# Patient Record
Sex: Male | Born: 1970 | Race: White | Hispanic: No | Smoking: Current every day smoker
Health system: Southern US, Community
[De-identification: ages and names within clinical notes are randomized; demographics above are authoritative.]

## PROBLEM LIST (undated history)

## (undated) DIAGNOSIS — F419 Anxiety disorder, unspecified: Secondary | ICD-10-CM

## (undated) DIAGNOSIS — R569 Unspecified convulsions: Secondary | ICD-10-CM

## (undated) DIAGNOSIS — G709 Myoneural disorder, unspecified: Secondary | ICD-10-CM

## (undated) DIAGNOSIS — Z9989 Dependence on other enabling machines and devices: Secondary | ICD-10-CM

## (undated) DIAGNOSIS — M549 Dorsalgia, unspecified: Secondary | ICD-10-CM

## (undated) HISTORY — PX: OTHER SURGICAL HISTORY: SHX169

## (undated) HISTORY — DX: Dorsalgia, unspecified: M54.9

## (undated) HISTORY — DX: Myoneural disorder, unspecified: G70.9

## (undated) HISTORY — DX: Unspecified convulsions: R56.9

## (undated) HISTORY — DX: Anxiety disorder, unspecified: F41.9

---

## 2019-03-02 ENCOUNTER — Other Ambulatory Visit: Payer: Self-pay

## 2019-03-02 ENCOUNTER — Emergency Department (HOSPITAL_COMMUNITY): Payer: Medicaid Other

## 2019-03-02 ENCOUNTER — Encounter (HOSPITAL_COMMUNITY): Payer: Self-pay | Admitting: *Deleted

## 2019-03-02 ENCOUNTER — Inpatient Hospital Stay (HOSPITAL_COMMUNITY)
Admission: EM | Admit: 2019-03-02 | Discharge: 2019-03-08 | DRG: 439 | Disposition: A | Discharge status: Home Health | Payer: Medicaid Other | Attending: Internal Medicine | Admitting: Internal Medicine

## 2019-03-02 DIAGNOSIS — K2980 Duodenitis without bleeding: Secondary | ICD-10-CM | POA: Diagnosis present

## 2019-03-02 DIAGNOSIS — F17210 Nicotine dependence, cigarettes, uncomplicated: Secondary | ICD-10-CM | POA: Diagnosis present

## 2019-03-02 DIAGNOSIS — D7589 Other specified diseases of blood and blood-forming organs: Secondary | ICD-10-CM | POA: Diagnosis present

## 2019-03-02 DIAGNOSIS — D6959 Other secondary thrombocytopenia: Secondary | ICD-10-CM | POA: Diagnosis present

## 2019-03-02 DIAGNOSIS — Z23 Encounter for immunization: Secondary | ICD-10-CM | POA: Diagnosis not present

## 2019-03-02 DIAGNOSIS — N179 Acute kidney failure, unspecified: Secondary | ICD-10-CM | POA: Diagnosis present

## 2019-03-02 DIAGNOSIS — K852 Alcohol induced acute pancreatitis without necrosis or infection: Principal | ICD-10-CM | POA: Diagnosis present

## 2019-03-02 DIAGNOSIS — K8020 Calculus of gallbladder without cholecystitis without obstruction: Secondary | ICD-10-CM | POA: Diagnosis present

## 2019-03-02 DIAGNOSIS — E871 Hypo-osmolality and hyponatremia: Secondary | ICD-10-CM | POA: Diagnosis present

## 2019-03-02 DIAGNOSIS — E872 Acidosis, unspecified: Secondary | ICD-10-CM | POA: Diagnosis present

## 2019-03-02 DIAGNOSIS — Z781 Physical restraint status: Secondary | ICD-10-CM

## 2019-03-02 DIAGNOSIS — K567 Ileus, unspecified: Secondary | ICD-10-CM | POA: Diagnosis present

## 2019-03-02 DIAGNOSIS — R109 Unspecified abdominal pain: Secondary | ICD-10-CM | POA: Diagnosis not present

## 2019-03-02 DIAGNOSIS — D649 Anemia, unspecified: Secondary | ICD-10-CM | POA: Diagnosis present

## 2019-03-02 DIAGNOSIS — K7010 Alcoholic hepatitis without ascites: Secondary | ICD-10-CM | POA: Diagnosis present

## 2019-03-02 DIAGNOSIS — I10 Essential (primary) hypertension: Secondary | ICD-10-CM | POA: Diagnosis present

## 2019-03-02 DIAGNOSIS — E876 Hypokalemia: Secondary | ICD-10-CM | POA: Diagnosis present

## 2019-03-02 DIAGNOSIS — F1010 Alcohol abuse, uncomplicated: Secondary | ICD-10-CM | POA: Diagnosis not present

## 2019-03-02 DIAGNOSIS — E878 Other disorders of electrolyte and fluid balance, not elsewhere classified: Secondary | ICD-10-CM | POA: Diagnosis present

## 2019-03-02 DIAGNOSIS — E0810 Diabetes mellitus due to underlying condition with ketoacidosis without coma: Secondary | ICD-10-CM

## 2019-03-02 DIAGNOSIS — F10231 Alcohol dependence with withdrawal delirium: Secondary | ICD-10-CM | POA: Diagnosis present

## 2019-03-02 DIAGNOSIS — K859 Acute pancreatitis without necrosis or infection, unspecified: Secondary | ICD-10-CM | POA: Diagnosis present

## 2019-03-02 DIAGNOSIS — R1084 Generalized abdominal pain: Secondary | ICD-10-CM

## 2019-03-02 DIAGNOSIS — Z20828 Contact with and (suspected) exposure to other viral communicable diseases: Secondary | ICD-10-CM | POA: Diagnosis present

## 2019-03-02 DIAGNOSIS — E119 Type 2 diabetes mellitus without complications: Secondary | ICD-10-CM

## 2019-03-02 DIAGNOSIS — I169 Hypertensive crisis, unspecified: Secondary | ICD-10-CM | POA: Diagnosis present

## 2019-03-02 HISTORY — DX: Dependence on other enabling machines and devices: Z99.89

## 2019-03-02 LAB — LACTIC ACID, PLASMA
Lactic Acid, Venous: 7.7 mmol/L (ref 0.5–1.9)
Lactic Acid, Venous: 6.5 mmol/L (ref 0.5–1.9)

## 2019-03-02 LAB — COMPREHENSIVE METABOLIC PANEL
ALT: 75 U/L — ABNORMAL HIGH (ref 0–44)
AST: 148 U/L — ABNORMAL HIGH (ref 15–41)
Albumin: 5 g/dL (ref 3.5–5.0)
Alkaline Phosphatase: 108 U/L (ref 38–126)
Anion gap: >20 — ABNORMAL HIGH (ref 5–15)
BUN: 11 mg/dL (ref 6–20)
CO2: 23 mmol/L (ref 22–32)
Calcium: 10.5 mg/dL — ABNORMAL HIGH (ref 8.9–10.3)
Chloride: 78 mmol/L — ABNORMAL LOW (ref 98–111)
Creatinine, Ser: 1.33 mg/dL — ABNORMAL HIGH (ref 0.61–1.24)
GFR calc Af Amer: >60 mL/min (ref >60)
GFR calc non Af Amer: >60 mL/min (ref >60)
Glucose, Bld: 217 mg/dL — ABNORMAL HIGH (ref 70–99)
Potassium: 2.2 mmol/L — CL (ref 3.5–5.1)
Sodium: 131 mmol/L — ABNORMAL LOW (ref 135–145)
Total Bilirubin: 3.3 mg/dL — ABNORMAL HIGH (ref 0.3–1.2)
Total Protein: 9 g/dL — ABNORMAL HIGH (ref 6.5–8.1)

## 2019-03-02 LAB — BASIC METABOLIC PANEL
Anion gap: >20 — ABNORMAL HIGH (ref 5–15)
Anion gap: 19 — ABNORMAL HIGH (ref 5–15)
BUN: 10 mg/dL (ref 6–20)
BUN: 10 mg/dL (ref 6–20)
CO2: 25 mmol/L (ref 22–32)
CO2: 26 mmol/L (ref 22–32)
Calcium: 9 mg/dL (ref 8.9–10.3)
Calcium: 8.3 mg/dL — ABNORMAL LOW (ref 8.9–10.3)
Chloride: 87 mmol/L — ABNORMAL LOW (ref 98–111)
Chloride: 88 mmol/L — ABNORMAL LOW (ref 98–111)
Creatinine, Ser: 1.05 mg/dL (ref 0.61–1.24)
Creatinine, Ser: 0.85 mg/dL (ref 0.61–1.24)
GFR calc Af Amer: >60 mL/min (ref >60)
GFR calc Af Amer: >60 mL/min (ref >60)
GFR calc non Af Amer: >60 mL/min (ref >60)
GFR calc non Af Amer: >60 mL/min (ref >60)
Glucose, Bld: 153 mg/dL — ABNORMAL HIGH (ref 70–99)
Glucose, Bld: 132 mg/dL — ABNORMAL HIGH (ref 70–99)
Potassium: 2.5 mmol/L — CL (ref 3.5–5.1)
Potassium: 3 mmol/L — ABNORMAL LOW (ref 3.5–5.1)
Sodium: 133 mmol/L — ABNORMAL LOW (ref 135–145)
Sodium: 133 mmol/L — ABNORMAL LOW (ref 135–145)

## 2019-03-02 LAB — MAGNESIUM: Magnesium: 0.7 mg/dL — CL (ref 1.7–2.4)

## 2019-03-02 LAB — BLOOD GAS, ARTERIAL
Acid-Base Excess: 7.9 mmol/L — ABNORMAL HIGH (ref 0.0–2.0)
Bicarbonate: 31.8 mmol/L — ABNORMAL HIGH (ref 20.0–28.0)
FIO2: 21
O2 Saturation: 94.8 %
Patient temperature: 38.1
pCO2 arterial: 38.2 mmHg (ref 32.0–48.0)
pH, Arterial: 7.523 — ABNORMAL HIGH (ref 7.350–7.450)
pO2, Arterial: 72.9 mmHg — ABNORMAL LOW (ref 83.0–108.0)

## 2019-03-02 LAB — CBG MONITORING, ED
Glucose-Capillary: 168 mg/dL — ABNORMAL HIGH (ref 70–99)
Glucose-Capillary: 139 mg/dL — ABNORMAL HIGH (ref 70–99)
Glucose-Capillary: 133 mg/dL — ABNORMAL HIGH (ref 70–99)

## 2019-03-02 LAB — URINALYSIS, ROUTINE W REFLEX MICROSCOPIC
Bilirubin Urine: NEGATIVE
Glucose, UA: >=500 mg/dL — AB
Ketones, ur: 5 mg/dL — AB
Leukocytes,Ua: NEGATIVE
Nitrite: NEGATIVE
Protein, ur: >=300 mg/dL — AB
Specific Gravity, Urine: 1.03 (ref 1.005–1.030)
pH: 5 (ref 5.0–8.0)

## 2019-03-02 LAB — CBC WITH DIFFERENTIAL/PLATELET
Abs Immature Granulocytes: 0.12 10*3/uL — ABNORMAL HIGH (ref 0.00–0.07)
Basophils Absolute: 0.1 10*3/uL (ref 0.0–0.1)
Basophils Relative: 0 %
Eosinophils Absolute: 2 10*3/uL — ABNORMAL HIGH (ref 0.0–0.5)
Eosinophils Relative: 12 %
HCT: 48 % (ref 39.0–52.0)
Hemoglobin: 17.4 g/dL — ABNORMAL HIGH (ref 13.0–17.0)
Immature Granulocytes: 1 %
Lymphocytes Relative: 3 %
Lymphs Abs: 0.5 10*3/uL — ABNORMAL LOW (ref 0.7–4.0)
MCH: 34.6 pg — ABNORMAL HIGH (ref 26.0–34.0)
MCHC: 36.3 g/dL — ABNORMAL HIGH (ref 30.0–36.0)
MCV: 95.4 fL (ref 80.0–100.0)
Monocytes Absolute: 1.9 10*3/uL — ABNORMAL HIGH (ref 0.1–1.0)
Monocytes Relative: 12 %
Neutro Abs: 11.6 10*3/uL — ABNORMAL HIGH (ref 1.7–7.7)
Neutrophils Relative %: 72 %
Platelets: 117 10*3/uL — ABNORMAL LOW (ref 150–400)
RBC: 5.03 MIL/uL (ref 4.22–5.81)
RDW: 11.8 % (ref 11.5–15.5)
WBC: 16.1 10*3/uL — ABNORMAL HIGH (ref 4.0–10.5)
nRBC: 0.2 % (ref 0.0–0.2)

## 2019-03-02 LAB — APTT: aPTT: 29 seconds (ref 24–36)

## 2019-03-02 LAB — PROTIME-INR
INR: 1.3 — ABNORMAL HIGH (ref 0.8–1.2)
Prothrombin Time: 15.9 seconds — ABNORMAL HIGH (ref 11.4–15.2)

## 2019-03-02 LAB — PHOSPHORUS: Phosphorus: 3.4 mg/dL (ref 2.5–4.6)

## 2019-03-02 LAB — LIPASE, BLOOD: Lipase: 1441 U/L — ABNORMAL HIGH (ref 11–51)

## 2019-03-02 MED ORDER — SODIUM CHLORIDE 0.9 % IV BOLUS (SEPSIS)
1000.0000 mL | Freq: Once | INTRAVENOUS | Status: AC
  Administered 2019-03-02: 15:00:00 1000 mL via INTRAVENOUS

## 2019-03-02 MED ORDER — MAGNESIUM SULFATE 2 GM/50ML IV SOLN
2.0000 g | Freq: Once | INTRAVENOUS | Status: AC
  Administered 2019-03-03: 2 g via INTRAVENOUS
  Filled 2019-03-02: qty 50

## 2019-03-02 MED ORDER — THIAMINE HCL 100 MG/ML IJ SOLN
100.0000 mg | Freq: Every day | INTRAMUSCULAR | Status: DC
  Administered 2019-03-02: 100 mg via INTRAVENOUS
  Filled 2019-03-02: qty 2

## 2019-03-02 MED ORDER — ONDANSETRON HCL 4 MG/2ML IJ SOLN
4.0000 mg | Freq: Once | INTRAMUSCULAR | Status: AC
  Administered 2019-03-02: 4 mg via INTRAVENOUS
  Filled 2019-03-02: qty 2

## 2019-03-02 MED ORDER — LORAZEPAM 2 MG/ML IJ SOLN: 0.0000 mg | Freq: Three times a day (TID) | INTRAMUSCULAR | Status: DC

## 2019-03-02 MED ORDER — FOLIC ACID 1 MG PO TABS
1.0000 mg | ORAL_TABLET | Freq: Every day | ORAL | Status: DC
  Administered 2019-03-02 – 2019-03-08 (×7): 1 mg via ORAL
  Filled 2019-03-02 (×7): qty 1

## 2019-03-02 MED ORDER — PROCHLORPERAZINE EDISYLATE 10 MG/2ML IJ SOLN
10.0000 mg | Freq: Once | INTRAMUSCULAR | Status: AC
  Administered 2019-03-02: 16:00:00 10 mg via INTRAVENOUS
  Filled 2019-03-02: qty 2

## 2019-03-02 MED ORDER — HYDROMORPHONE HCL 1 MG/ML IJ SOLN
1.0000 mg | INTRAMUSCULAR | Status: DC | PRN
  Administered 2019-03-02 – 2019-03-07 (×19): 1 mg via INTRAVENOUS
  Filled 2019-03-02 (×20): qty 1

## 2019-03-02 MED ORDER — ONDANSETRON HCL 4 MG/2ML IJ SOLN
4.0000 mg | Freq: Four times a day (QID) | INTRAMUSCULAR | Status: DC | PRN
  Administered 2019-03-02 – 2019-03-05 (×4): 4 mg via INTRAVENOUS
  Filled 2019-03-02 (×4): qty 2

## 2019-03-02 MED ORDER — HYDROMORPHONE HCL 1 MG/ML IJ SOLN
1.0000 mg | Freq: Once | INTRAMUSCULAR | Status: AC
  Administered 2019-03-02: 17:00:00 1 mg via INTRAVENOUS
  Filled 2019-03-02: qty 1

## 2019-03-02 MED ORDER — VITAMIN B-1 100 MG PO TABS
100.0000 mg | ORAL_TABLET | Freq: Every day | ORAL | Status: DC
  Administered 2019-03-03 – 2019-03-08 (×6): 100 mg via ORAL
  Filled 2019-03-02 (×6): qty 1

## 2019-03-02 MED ORDER — POTASSIUM CHLORIDE 10 MEQ/100ML IV SOLN
10.0000 meq | INTRAVENOUS | Status: AC
  Administered 2019-03-02 – 2019-03-03 (×6): 10 meq via INTRAVENOUS
  Filled 2019-03-02 (×5): qty 100

## 2019-03-02 MED ORDER — INSULIN ASPART 100 UNIT/ML ~~LOC~~ SOLN
0.0000 [IU] | Freq: Four times a day (QID) | SUBCUTANEOUS | Status: DC
  Administered 2019-03-02 – 2019-03-07 (×3): 2 [IU] via SUBCUTANEOUS
  Filled 2019-03-02 (×2): qty 1

## 2019-03-02 MED ORDER — POTASSIUM CHLORIDE CRYS ER 20 MEQ PO TBCR
40.0000 meq | EXTENDED_RELEASE_TABLET | Freq: Once | ORAL | Status: AC
  Administered 2019-03-02: 40 meq via ORAL
  Filled 2019-03-02: qty 2

## 2019-03-02 MED ORDER — SODIUM CHLORIDE 0.9 % IV SOLN
2.0000 g | Freq: Once | INTRAVENOUS | Status: DC
  Administered 2019-03-02: 2 g via INTRAVENOUS
  Filled 2019-03-02: qty 20

## 2019-03-02 MED ORDER — SODIUM CHLORIDE 0.9 % IV SOLN
1000.0000 mL | INTRAVENOUS | Status: DC
  Administered 2019-03-02: 1000 mL via INTRAVENOUS

## 2019-03-02 MED ORDER — METRONIDAZOLE IN NACL 5-0.79 MG/ML-% IV SOLN
500.0000 mg | Freq: Once | INTRAVENOUS | Status: DC
  Filled 2019-03-02: qty 100

## 2019-03-02 MED ORDER — SODIUM CHLORIDE 0.9 % IV BOLUS (SEPSIS)
1000.0000 mL | Freq: Once | INTRAVENOUS | Status: AC
  Administered 2019-03-02: 16:00:00 1000 mL via INTRAVENOUS

## 2019-03-02 MED ORDER — ADULT MULTIVITAMIN W/MINERALS CH
1.0000 | ORAL_TABLET | Freq: Every day | ORAL | Status: DC
  Administered 2019-03-02 – 2019-03-08 (×7): 1 via ORAL
  Filled 2019-03-02 (×7): qty 1

## 2019-03-02 MED ORDER — IOHEXOL 300 MG/ML  SOLN
100.0000 mL | Freq: Once | INTRAMUSCULAR | Status: AC | PRN
  Administered 2019-03-02: 16:00:00 100 mL via INTRAVENOUS

## 2019-03-02 MED ORDER — ONDANSETRON HCL 4 MG PO TABS: 4.0000 mg | ORAL_TABLET | Freq: Four times a day (QID) | ORAL | Status: DC | PRN

## 2019-03-02 MED ORDER — INSULIN GLARGINE 100 UNIT/ML ~~LOC~~ SOLN
15.0000 [IU] | Freq: Every day | SUBCUTANEOUS | Status: DC
  Administered 2019-03-02: 15 [IU] via SUBCUTANEOUS
  Filled 2019-03-02 (×3): qty 0.15

## 2019-03-02 MED ORDER — SODIUM CHLORIDE 0.9 % IV SOLN
1000.0000 mL | INTRAVENOUS | Status: DC
  Administered 2019-03-02 – 2019-03-06 (×13): 1000 mL via INTRAVENOUS

## 2019-03-02 MED ORDER — LORAZEPAM 2 MG/ML IJ SOLN
1.0000 mg | INTRAMUSCULAR | Status: AC | PRN
  Administered 2019-03-03: 13:00:00 1 mg via INTRAVENOUS
  Administered 2019-03-03 – 2019-03-04 (×4): 2 mg via INTRAVENOUS
  Administered 2019-03-04: 14:00:00 4 mg via INTRAVENOUS
  Administered 2019-03-04 – 2019-03-05 (×5): 2 mg via INTRAVENOUS
  Administered 2019-03-05: 14:00:00 1 mg via INTRAVENOUS
  Filled 2019-03-02 (×3): qty 1
  Filled 2019-03-02: qty 2
  Filled 2019-03-02 (×8): qty 1

## 2019-03-02 MED ORDER — LORAZEPAM 2 MG/ML IJ SOLN
0.0000 mg | INTRAMUSCULAR | Status: DC
  Administered 2019-03-02: 2 mg via INTRAVENOUS
  Administered 2019-03-03: 11:00:00 1 mg via INTRAVENOUS
  Filled 2019-03-02 (×2): qty 1

## 2019-03-02 MED ORDER — POTASSIUM CHLORIDE 10 MEQ/100ML IV SOLN
10.0000 meq | INTRAVENOUS | Status: AC
  Administered 2019-03-02 (×2): 10 meq via INTRAVENOUS
  Filled 2019-03-02 (×2): qty 100

## 2019-03-02 MED ORDER — LORAZEPAM 1 MG PO TABS
1.0000 mg | ORAL_TABLET | ORAL | Status: AC | PRN
  Administered 2019-03-04: 10:00:00 4 mg via ORAL
  Filled 2019-03-02: qty 4
  Filled 2019-03-02: qty 2

## 2019-03-02 MED ORDER — MORPHINE SULFATE (PF) 4 MG/ML IV SOLN
4.0000 mg | Freq: Once | INTRAVENOUS | Status: AC
  Administered 2019-03-02: 15:00:00 4 mg via INTRAVENOUS
  Filled 2019-03-02: qty 1

## 2019-03-02 MED ORDER — ENOXAPARIN SODIUM 40 MG/0.4ML ~~LOC~~ SOLN
40.0000 mg | SUBCUTANEOUS | Status: DC
  Administered 2019-03-02 – 2019-03-03 (×2): 40 mg via SUBCUTANEOUS
  Filled 2019-03-02 (×3): qty 0.4

## 2019-03-02 NOTE — Progress Notes (Signed)
Patient ID: Timothy Houston, male   DOB: 20-Mar-1971, 48 y.o.   MRN: [id number]  Anion gap from 30 to 21. Chloride, potassium, and sodium all improving. Will continue with current management.   Truett Mainland, DO 03/02/2019 9:20 PM

## 2019-03-02 NOTE — H&P (Signed)
History and Physical  Timothy Houston NWG:[id number] DOB: 07/09/1970 DOA: 03/02/2019  Referring physician: Claude MangesJohana Soto, PA-C, ED provider PCP: Patient, No Pcp Per  Outpatient Specialists:   Patient Coming From: Home  Chief Complaint: Abdominal pain, nausea, vomiting x2 days.  HPI: Timothy Houston is a 48 y.o. male with a history of alcohol abuse.  Patient seen for nausea, vomiting, abdominal pain x2 days.  He has been unable to tolerate any food or oral liquids.  His pain is worsening.  Pain was worse with attempts to consume oral food or liquids.  No palliating factors.  Patient is an alcoholic and drinks a case of beer every couple of days.  He gets the shakes when he does not have alcohol for a couple of days.  His last drink was a couple days ago.  Emergency Department Course: Lipase elevated to 1200.  CT of the abdomen shows acute pancreatitis with duodenitis.  Potassium is 2.2 with a anion gap of greater than 20.  Bicarb is normal at 23.  LFTs minimally elevated.  Lactic acid 7.7 and 6.5 on repeat.  White count 16.1.  Patient did receive a gram of Rocephin with concerns of sepsis.  He was about to get Flagyl and this was discontinued due to the realization that he had pancreatitis with a lactic acidosis.  Review of Systems:   Pt denies any fevers, chills, nausea, vomiting, diarrhea, constipation, shortness of breath, dyspnea on exertion, orthopnea, cough, wheezing, palpitations, headache, vision changes, lightheadedness, dizziness, melena, rectal bleeding.  Review of systems are otherwise negative  Past Medical History:  Diagnosis Date   Ambulates with cane    History reviewed. No pertinent surgical history. Social History:  reports that he has been smoking cigarettes. He has been smoking about 0.50 packs per day. He has never used smokeless tobacco. He reports current alcohol use. He reports current drug use. Drug: Marijuana. Patient lives at home  No Known Allergies  No family  history on file.  family history of diabetes  Prior to Admission medications   Not on File    Physical Exam: BP (!) 172/110    Pulse (!) 144    Temp (!) 100.6 F (38.1 C) (Rectal)    Resp (!) 22    Ht 5\' 8"  (1.727 m)    Wt 88.5 kg    SpO2 98%    BMI 29.65 kg/m    General: Middle-age male. Awake and alert and oriented x3. No acute cardiopulmonary distress.   HEENT: Normocephalic atraumatic.  Right and left ears normal in appearance.  Pupils equal, round, reactive to light. Extraocular muscles are intact. Sclerae anicteric and noninjected.  Moist mucosal membranes. No mucosal lesions.   Neck: Neck supple without lymphadenopathy. No carotid bruits. No masses palpated.   Cardiovascular: Regular rate with normal S1-S2 sounds. No murmurs, rubs, gallops auscultated. No JVD.   Respiratory: Good respiratory effort with no wheezes, rales, rhonchi. Lungs clear to auscultation bilaterally.  No accessory muscle use.  Abdomen: Soft, epigastric and left upper quadrant tenderness with guarding.  No rebound tenderness. Nondistended. Active bowel sounds. No masses or hepatosplenomegaly   Skin: No rashes, lesions, or ulcerations.  Dry, warm to touch. 2+ dorsalis pedis and radial pulses.  Musculoskeletal: No calf or leg pain. All major joints not erythematous nontender.  No upper or lower joint deformation.  Good ROM.  No contractures   Psychiatric: Intact judgment and insight. Pleasant and cooperative.  Neurologic: No focal neurological deficits. Strength is 5/5 and  symmetric in upper and lower extremities.  Cranial nerves II through XII are grossly intact.           Labs on Admission: I have personally reviewed following labs and imaging studies  CBC: Recent Labs  Lab 03/02/19 1440  WBC 16.1*  NEUTROABS 11.6*  HGB 17.4*  HCT 48.0  MCV 95.4  PLT 631*   Basic Metabolic Panel: Recent Labs  Lab 03/02/19 1440  NA 131*  K 2.2*  CL 78*  CO2 23  GLUCOSE 217*  BUN 11  CREATININE 1.33*    CALCIUM 10.5*   GFR: Estimated Creatinine Clearance: 74.2 mL/min (A) (by C-G formula based on SCr of 1.33 mg/dL (H)). Liver Function Tests: Recent Labs  Lab 03/02/19 1440  AST 148*  ALT 75*  ALKPHOS 108  BILITOT 3.3*  PROT 9.0*  ALBUMIN 5.0   Recent Labs  Lab 03/02/19 1440  LIPASE 1,441*   No results for input(s): AMMONIA in the last 168 hours. Coagulation Profile: Recent Labs  Lab 03/02/19 1613  INR 1.3*   Cardiac Enzymes: No results for input(s): CKTOTAL, CKMB, CKMBINDEX, TROPONINI in the last 168 hours. BNP (last 3 results) No results for input(s): PROBNP in the last 8760 hours. HbA1C: No results for input(s): HGBA1C in the last 72 hours. CBG: Recent Labs  Lab 03/02/19 1733  GLUCAP 168*   Lipid Profile: No results for input(s): CHOL, HDL, LDLCALC, TRIG, CHOLHDL, LDLDIRECT in the last 72 hours. Thyroid Function Tests: No results for input(s): TSH, T4TOTAL, FREET4, T3FREE, THYROIDAB in the last 72 hours. Anemia Panel: No results for input(s): VITAMINB12, FOLATE, FERRITIN, TIBC, IRON, RETICCTPCT in the last 72 hours. Urine analysis:    Component Value Date/Time   COLORURINE AMBER (A) 03/02/2019 1620   APPEARANCEUR HAZY (A) 03/02/2019 1620   LABSPEC 1.030 03/02/2019 1620   PHURINE 5.0 03/02/2019 1620   GLUCOSEU >=500 (A) 03/02/2019 1620   HGBUR MODERATE (A) 03/02/2019 1620   BILIRUBINUR NEGATIVE 03/02/2019 1620   KETONESUR 5 (A) 03/02/2019 1620   PROTEINUR >=300 (A) 03/02/2019 1620   NITRITE NEGATIVE 03/02/2019 1620   LEUKOCYTESUR NEGATIVE 03/02/2019 1620   Sepsis Labs: @LABRCNTIP (procalcitonin:4,lacticidven:4) ) Recent Results (from the past 240 hour(s))  Blood Culture (routine x 2)     Status: None (Preliminary result)   Collection Time: 03/02/19  2:47 PM   Specimen: Left Antecubital; Blood  Result Value Ref Range Status   Specimen Description LEFT ANTECUBITAL  Final   Special Requests   Final    BOTTLES DRAWN AEROBIC AND ANAEROBIC Blood  Culture adequate volume Performed at [facility], [address], [place], [place] [address]    Culture PENDING  Incomplete   Report Status PENDING  Incomplete  Blood Culture (routine x 2)     Status: None (Preliminary result)   Collection Time: 03/02/19  2:49 PM   Specimen: Right Antecubital; Blood  Result Value Ref Range Status   Specimen Description RIGHT ANTECUBITAL  Final   Special Requests   Final    BOTTLES DRAWN AEROBIC AND ANAEROBIC Blood Culture adequate volume Performed at [facility], [address], [place],  [address]    Culture PENDING  Incomplete   Report Status PENDING  Incomplete     Radiological Exams on Admission: Ct Abdomen Pelvis W Contrast  Result Date: 03/02/2019 CLINICAL DATA:  Pt c/o severe mid abdominal pain and vomiting x 2 days. Denies diarrhea. Pt is diaphoretic^171mL OMNIPAQUE IOHEXOL 300 MG/ML SOLN EXAM: CT ABDOMEN AND PELVIS WITH CONTRAST TECHNIQUE:  Multidetector CT imaging of the abdomen and pelvis was performed using the standard protocol following bolus administration of intravenous contrast. CONTRAST:  OMNIPAQUE IOHEXOL 300 MG/ML  SOLN COMPARISON:  None. FINDINGS: Lower chest: Bandlike opacity at the right lung base likely reflects atelectasis. No pleural or pericardial effusion. Hepatobiliary: Diffuse hepatic steatosis. There are multiple small layering gallstones. No evidence of gallbladder wall thickening. No biliary dilation. Pancreas: Heterogeneous appearance of the pancreatic head with peripancreatic fat stranding predominantly involving the pancreatic head but extending diffusely along the body and tail most likely representing acute pancreatitis. No focal peripancreatic fluid collection. No pancreatic duct dilation. There is duodenal wall thickening in the first through fourth portions with associated fat stranding. Spleen: Normal in size without focal abnormality. Adrenals/Urinary Tract: Adrenal glands are unremarkable. Kidneys are  normal, without renal calculi, focal lesion, or hydronephrosis. Bladder is unremarkable. Stomach/Bowel: Stomach is within normal limits. Appendix appears normal. Multiple mildly dilated loops of both large and small bowel favored to represent ileus. No bowel wall thickening. No free air. Vascular/Lymphatic: No significant vascular findings are present. No enlarged abdominal or pelvic lymph nodes. Reproductive: Prostate is unremarkable. Other: No abdominal wall hernia or abnormality. Musculoskeletal: Age-indeterminate mild height loss at the T11 vertebral body. IMPRESSION: 1. Heterogeneous appearance of the pancreatic head with peripancreatic fat stranding predominantly involving the pancreatic head but extending along the body and tail, most likely representing acute pancreatitis. No focal peripancreatic fluid collection. 2. Duodenal wall thickening with inflammatory changes likely a reactive duodenitis. 3. Hepatic steatosis. 4. Cholelithiasis without evidence of cholecystitis. 5. Multiple mildly dilated loops of both large and small bowel favored to represent ileus. 6. Age-indeterminate height loss at the T11 vertebral body. Electronically Signed   By: Emmaline Kluver M.D.   On: 03/02/2019 16:09    EKG: Independently reviewed.  Sinus tachycardia with possible old anterior infarct.  No acute ST changes  Assessment/Plan: Principal Problem:   Pancreatitis Active Problems:   Alcohol abuse   Type 2 diabetes mellitus without complication (HCC)   Lactic acidosis   Acute hypokalemia    This patient was discussed with the ED physician, including pertinent vitals, physical exam findings, labs, and imaging.  We also discussed care given by the ED provider.  1. Pancreatitis a. Admit to stepdown b. Recheck lipase in the morning c. Aggressive fluid hydration d. N.p.o. e. Dilaudid for pain control 2. Type 2 diabetes a. Bicarb is normal b. Question of whether the patient is in DKA given no clear  acidosis c. Placed on Lantus 15 units with sliding scale every 6 hours d. Hemoglobin A1c is pending e. We will hold off from insulin drip as the patient severely hypokalemic, however may need to give D5 normal saline with insulin if electrolytes do not start to return to normal f. BMPs every 4 hours 3. Alcohol abuse a. Withdrawal protocol 4. Lactic acidosis a. Improvement of lactic acidosis b. Repeat lactic acid in the morning 5. Acute hypokalemia a. Severe hypokalemia with hypochloremia, likely secondary to GI losses b. 6 runs of IV potassium ordered, will also give oral potassium as the patient tolerated oral potassium in the ER c. Repeat BMPs every 4 hours for now  DVT prophylaxis: Lovenox Consultants: None Code Status: Full code Family Communication: None Disposition Plan: Patient should be able to return home following resolution of pancreatitis and improvement of electrolyte abnormalities   Levie Heritage, DO

## 2019-03-02 NOTE — ED Notes (Signed)
Date and time results received: 03/02/19 2332   Test: Magnesium Critical Value: 0.7  Name of Provider Notified: Nehemiah Settle, MD

## 2019-03-02 NOTE — ED Provider Notes (Signed)
[place] [facility]   CSN: [id number] Arrival date & time: 03/02/19  1404     History   Chief Complaint Chief Complaint  Patient presents with   Abdominal Pain    HPI Timothy Houston is a 48 y.o. male.     48 y.o male with no PMH presents to the ED with a chief complaint of abdominal pain x 2 days. Patient describes a sharp pain to the epigastric region without any radiation.  He also endorses anorexia, reports he has been unable to keep any of his food down for the past 2 days.  Endorses multiple episodes of vomiting, without any blood.  He has not tried any medication for improvement in symptoms.  Reports his last meal was prior to arrival cracker jacks and Pepsi's.  He also endorses chills and a subjective fever although he does not have a thermometer at home.  Reports the pain seems to be worsening as the days pass.  Last bowel movement was yesterday, normal in nature.  Does endorse drinking a 12 pack every 3 days.  Has no prior surgical history to his abdomen.  Denies any suspicious food intakes.  He denies any chest pain, shortness of breath, urinary symptoms.   The history is provided by the patient.  Abdominal Pain Pain location:  Generalized Pain quality: not sharp   Pain radiates to:  Does not radiate Pain severity:  Severe Onset quality:  Gradual Duration:  2 days Timing:  Constant Progression:  Worsening Chronicity:  New Context: alcohol use   Context: not diet changes, not previous surgeries, not recent travel, not sick contacts and not suspicious food intake   Relieved by:  Nothing Worsened by:  Nothing Ineffective treatments:  None tried Associated symptoms: anorexia, belching, chills, diarrhea, fever and nausea   Associated symptoms: no chest pain, no cough, no dysuria, no hematuria, no shortness of breath, no sore throat and no vomiting   Risk factors: alcohol abuse   Risk factors: has not had multiple surgeries     Past Medical  History:  Diagnosis Date   Ambulates with cane     There are no active problems to display for this patient.   History reviewed. No pertinent surgical history.      Home Medications    Prior to Admission medications   Not on File    Family History No family history on file.  Social History Social History   Tobacco Use   Smoking status: Current Every Day Smoker    Packs/day: 0.50    Types: Cigarettes   Smokeless tobacco: Never Used  Substance Use Topics   Alcohol use: Yes    Comment: on the weekends    Drug use: Yes    Types: Marijuana     Allergies   Patient has no known allergies.   Review of Systems Review of Systems  Constitutional: Positive for chills and fever.  HENT: Negative for ear pain and sore throat.   Eyes: Negative for pain and visual disturbance.  Respiratory: Negative for cough and shortness of breath.   Cardiovascular: Negative for chest pain and palpitations.  Gastrointestinal: Positive for abdominal pain, anorexia, diarrhea and nausea. Negative for vomiting.  Genitourinary: Negative for dysuria and hematuria.  Musculoskeletal: Negative for arthralgias and back pain.  Skin: Negative for color change and rash.  Neurological: Negative for seizures and syncope.  All other systems reviewed and are negative.    Physical Exam Updated Vital Signs BP (!) 147/101  Pulse (!) 144    Temp 97.9 F (36.6 C) (Oral)    Resp 19    Ht  (1.727 m)    Wt 88.5 kg    SpO2 95%    BMI 29.65 kg/m   Physical Exam Vitals signs and nursing note reviewed.  Constitutional:      Appearance: He is well-developed. He is ill-appearing and diaphoretic.  HENT:     Head: Normocephalic and atraumatic.     Mouth/Throat:     Mouth: Mucous membranes are dry.     Tongue: Tongue does not deviate from midline.     Pharynx: Oropharynx is clear. Uvula midline.  Eyes:     General: No scleral icterus.    Pupils: Pupils are equal, round, and reactive to light.    Neck:     Musculoskeletal: Normal range of motion.  Cardiovascular:     Rate and Rhythm: Tachycardia present.     Heart sounds: Normal heart sounds.  Pulmonary:     Effort: Pulmonary effort is normal.     Breath sounds: Normal breath sounds. No wheezing.  Chest:     Chest wall: No tenderness.  Abdominal:     General: Bowel sounds are decreased. There is distension.     Palpations: Abdomen is soft.     Tenderness: There is generalized abdominal tenderness. There is guarding and rebound. There is no right CVA tenderness or left CVA tenderness.     Hernia: No hernia is present.     Comments: Significant tenderness to palpation throughout whole stomach.  There is guarding, rebound.  Bowel sounds are diminished.  Musculoskeletal:        General: No tenderness or deformity.  Neurological:     Mental Status: He is alert and oriented to person, place, and time.      ED Treatments / Results  Labs (all labs ordered are listed, but only abnormal results are displayed) Labs Reviewed  LACTIC ACID, PLASMA - Abnormal; Notable for the following components:      Result Value   Lactic Acid, Venous 7.7 (*)    All other components within normal limits  COMPREHENSIVE METABOLIC PANEL - Abnormal; Notable for the following components:   Sodium 131 (*)    Potassium 2.2 (*)    Chloride 78 (*)    Glucose, Bld 217 (*)    Creatinine, Ser 1.33 (*)    Calcium 10.5 (*)    Total Protein 9.0 (*)    AST 148 (*)    ALT 75 (*)    Total Bilirubin 3.3 (*)    Anion gap >20 (*)    All other components within normal limits  CBC WITH DIFFERENTIAL/PLATELET - Abnormal; Notable for the following components:   WBC 16.1 (*)    Hemoglobin 17.4 (*)    MCH 34.6 (*)    MCHC 36.3 (*)    Platelets 117 (*)    Neutro Abs 11.6 (*)    Lymphs Abs 0.5 (*)    Monocytes Absolute 1.9 (*)    Eosinophils Absolute 2.0 (*)    Abs Immature Granulocytes 0.12 (*)    All other components within normal limits  PROTIME-INR -  Abnormal; Notable for the following components:   Prothrombin Time 15.9 (*)    INR 1.3 (*)    All other components within normal limits  CULTURE, BLOOD (ROUTINE X 2)  CULTURE, BLOOD (ROUTINE X 2)  URINE CULTURE  SARS CORONAVIRUS 2 (TAT 6-24 HRS)  APTT  LACTIC  ACID, PLASMA  URINALYSIS, ROUTINE W REFLEX MICROSCOPIC  LIPASE, BLOOD    EKG None  Radiology Ct Abdomen Pelvis W Contrast  Result Date: 03/02/2019 CLINICAL DATA:  Pt c/o severe mid abdominal pain and vomiting x 2 days. Denies diarrhea. Pt is diaphoretic^151mL OMNIPAQUE IOHEXOL 300 MG/ML SOLN EXAM: CT ABDOMEN AND PELVIS WITH CONTRAST TECHNIQUE: Multidetector CT imaging of the abdomen and pelvis was performed using the standard protocol following bolus administration of intravenous contrast. CONTRAST:  OMNIPAQUE IOHEXOL 300 MG/ML  SOLN COMPARISON:  None. FINDINGS: Lower chest: Bandlike opacity at the right lung base likely reflects atelectasis. No pleural or pericardial effusion. Hepatobiliary: Diffuse hepatic steatosis. There are multiple small layering gallstones. No evidence of gallbladder wall thickening. No biliary dilation. Pancreas: Heterogeneous appearance of the pancreatic head with peripancreatic fat stranding predominantly involving the pancreatic head but extending diffusely along the body and tail most likely representing acute pancreatitis. No focal peripancreatic fluid collection. No pancreatic duct dilation. There is duodenal wall thickening in the first through fourth portions with associated fat stranding. Spleen: Normal in size without focal abnormality. Adrenals/Urinary Tract: Adrenal glands are unremarkable. Kidneys are normal, without renal calculi, focal lesion, or hydronephrosis. Bladder is unremarkable. Stomach/Bowel: Stomach is within normal limits. Appendix appears normal. Multiple mildly dilated loops of both large and small bowel favored to represent ileus. No bowel wall thickening. No free air.  Vascular/Lymphatic: No significant vascular findings are present. No enlarged abdominal or pelvic lymph nodes. Reproductive: Prostate is unremarkable. Other: No abdominal wall hernia or abnormality. Musculoskeletal: Age-indeterminate mild height loss at the T11 vertebral body. IMPRESSION: 1. Heterogeneous appearance of the pancreatic head with peripancreatic fat stranding predominantly involving the pancreatic head but extending along the body and tail, most likely representing acute pancreatitis. No focal peripancreatic fluid collection. 2. Duodenal wall thickening with inflammatory changes likely a reactive duodenitis. 3. Hepatic steatosis. 4. Cholelithiasis without evidence of cholecystitis. 5. Multiple mildly dilated loops of both large and small bowel favored to represent ileus. 6. Age-indeterminate height loss at the T11 vertebral body. Electronically Signed   By: Emmaline Kluver M.D.   On: 03/02/2019 16:09    Procedures .Critical Care Performed by: Claude Manges, PA-C Authorized by: Claude Manges, PA-C   Critical care provider statement:    Critical care time (minutes):  35   Critical care start time:  03/02/2019 2:00 PM   Critical care end time:  03/02/2019 2:35 PM   Critical care time was exclusive of:  Separately billable procedures and treating other patients   Critical care was necessary to treat or prevent imminent or life-threatening deterioration of the following conditions:  Sepsis   Critical care was time spent personally by me on the following activities:  Blood draw for specimens, development of treatment plan with patient or surrogate, discussions with consultants, evaluation of patient's response to treatment, examination of patient, obtaining history from patient or surrogate, ordering and performing treatments and interventions, ordering and review of laboratory studies, ordering and review of radiographic studies, pulse oximetry, re-evaluation of patient's condition and review of  old charts   (including critical care time)  Medications Ordered in ED Medications  0.9 %  sodium chloride infusion (1,000 mLs Intravenous New Bag/Given 03/02/19 1506)  sodium chloride 0.9 % bolus 1,000 mL (1,000 mLs Intravenous New Bag/Given 03/02/19 1507)    And  sodium chloride 0.9 % bolus 1,000 mL (1,000 mLs Intravenous New Bag/Given 03/02/19 1623)    And  sodium chloride 0.9 % bolus 1,000 mL (1,000  mLs Intravenous New Bag/Given 03/02/19 1506)  potassium chloride 10 mEq in 100 mL IVPB (10 mEq Intravenous New Bag/Given 03/02/19 1620)  potassium chloride SA (K-DUR) CR tablet 40 mEq (has no administration in time range)  morphine 4 MG/ML injection 4 mg (4 mg Intravenous Given 03/02/19 1500)  ondansetron (ZOFRAN) injection 4 mg (4 mg Intravenous Given 03/02/19 1500)  iohexol (OMNIPAQUE) 300 MG/ML solution 100 mL (100 mLs Intravenous Contrast Given 03/02/19 1547)  prochlorperazine (COMPAZINE) injection 10 mg (10 mg Intravenous Given 03/02/19 1613)  HYDROmorphone (DILAUDID) injection 1 mg (1 mg Intravenous Given 03/02/19 1636)     Initial Impression / Assessment and Plan / ED Course  I have reviewed the triage vital signs and the nursing notes.  Pertinent labs & imaging results that were available during my care of the patient were reviewed by me and considered in my medical decision making (see chart for details).  Clinical Course as of Mar 01 1646  Fri Mar 02, 2019  1640 Anion gap(!): >20 [JS]  1640 Glucose(!): 217 [JS]    Clinical Course User Index [JS] Janeece Fitting, PA-C    Patient with no past medical history listed on his chart, currently ambulatory with a cane presents to the ED with complaints of abdominal pain x2 days.  He arrived in the ED significantly tachycardic with a heart rate of 144, oral temperature was recorded as 97.9, he reports has been drinking Pepsi's throughout the day in order to help with his symptoms although he vomits them right after drinking them.  Will obtain  rectal temp to further evaluate.  Some suspicion the patient is likely septic.  There is significant tenderness throughout his whole abdomen, guarding is present, bowel sounds are slightly diminished.  Does not have any prior history of surgical intervention to his abdomen, has not been taking any medication for his symptoms.  He does endorse drinking alcohol along a 12 pack every 3 to 4 days.  Some suspicion for alcohol abuse.  Will obtain labs, CT imaging to further evaluate his abdominal pain.  Sepsis criteria initiated, will bolus replenish patient at this time.  CBC remarkable for leukocytosis of 16.1, hemoconcentrated.  H&P remarkable for some hyponatremia, severe hypokalemia at 2.2, will IV replace along with orally replace.  Glucose is elevated today at 217, he does not have a prior history of diabetes that he is aware of.  Mild AKI at 1.33.  LFTs are elevated 148:75, there is significant tenderness on exam especially on the epigastric region.  Lactic acid was 7.7, will now start prophylactic antibiotics for intra-abdominal infection with Rocephin and Flagyl.  CBC also remarkable for a glucose of 217, does not have any previous history of diabetes, anion gap is also at 20.  Patient likely in DKA.   CT abdomen showed: 1. Heterogeneous appearance of the pancreatic head with  peripancreatic fat stranding predominantly involving the pancreatic  head but extending along the body and tail, most likely representing  acute pancreatitis. No focal peripancreatic fluid collection.  2. Duodenal wall thickening with inflammatory changes likely a  reactive duodenitis.  3. Hepatic steatosis.  4. Cholelithiasis without evidence of cholecystitis.  5. Multiple mildly dilated loops of both large and small bowel  favored to represent ileus.  6. Age-indeterminate height loss at the T11 vertebral body.      4:47 PM spoke to hospitalist Dr. Sheran Lawless who will admit patient for further management of  pancreatitis, new onset diabetes, hypokalemia.   Portions of this note  were generated with Scientist, clinical (histocompatibility and immunogenetics). Dictation errors may occur despite best attempts at proofreading.  Final Clinical Impressions(s) / ED Diagnoses   Final diagnoses:  Generalized abdominal pain  Hypokalemia  Acute pancreatitis, unspecified complication status, unspecified pancreatitis type  New onset type 2 diabetes mellitus (HCC)  Diabetic ketoacidosis without coma associated with diabetes mellitus due to underlying condition [facility])    ED Discharge Orders    None       Claude Manges, PA-C 03/02/19 1648    Blane Ohara, MD 03/05/19 [phone]

## 2019-03-02 NOTE — ED Triage Notes (Signed)
Pt c/o severe mid abdominal pain and vomiting x 2 days. Denies diarrhea. Pt is diaphoretic, HR in the 140's in triage.   Pt's friend reports pt is a "heavy alcoholic" and is afraid pt may be withdrawing. Pt denies drinking daily and reports he only drinks on the weekends.

## 2019-03-02 NOTE — ED Notes (Signed)
Date and time results received: 03/02/19 3:38 PM  (use smartphrase ".now" to insert current time)  Test: k+ 2.2 Lactic 7.7 Critical Value:   Name of Provider Notified: Theda Sers, [place]  Orders Received? Or Actions Taken?:

## 2019-03-03 ENCOUNTER — Encounter: Payer: Self-pay | Admitting: Internal Medicine

## 2019-03-03 DIAGNOSIS — F1010 Alcohol abuse, uncomplicated: Secondary | ICD-10-CM

## 2019-03-03 DIAGNOSIS — E876 Hypokalemia: Secondary | ICD-10-CM

## 2019-03-03 DIAGNOSIS — K859 Acute pancreatitis without necrosis or infection, unspecified: Secondary | ICD-10-CM

## 2019-03-03 LAB — CBG MONITORING, ED
Glucose-Capillary: 70 mg/dL (ref 70–99)
Glucose-Capillary: 73 mg/dL (ref 70–99)

## 2019-03-03 LAB — CBC
HCT: 39.8 % (ref 39.0–52.0)
Hemoglobin: 14.4 g/dL (ref 13.0–17.0)
MCH: 35.9 pg — ABNORMAL HIGH (ref 26.0–34.0)
MCHC: 36.2 g/dL — ABNORMAL HIGH (ref 30.0–36.0)
MCV: 99.3 fL (ref 80.0–100.0)
Platelets: 77 10*3/uL — ABNORMAL LOW (ref 150–400)
RBC: 4.01 MIL/uL — ABNORMAL LOW (ref 4.22–5.81)
RDW: 12.2 % (ref 11.5–15.5)
WBC: 13 10*3/uL — ABNORMAL HIGH (ref 4.0–10.5)
nRBC: 0.2 % (ref 0.0–0.2)

## 2019-03-03 LAB — COMPREHENSIVE METABOLIC PANEL
ALT: 50 U/L — ABNORMAL HIGH (ref 0–44)
AST: 76 U/L — ABNORMAL HIGH (ref 15–41)
Albumin: 3.6 g/dL (ref 3.5–5.0)
Alkaline Phosphatase: 73 U/L (ref 38–126)
Anion gap: 13 (ref 5–15)
BUN: 11 mg/dL (ref 6–20)
CO2: 29 mmol/L (ref 22–32)
Calcium: 8.1 mg/dL — ABNORMAL LOW (ref 8.9–10.3)
Chloride: 93 mmol/L — ABNORMAL LOW (ref 98–111)
Creatinine, Ser: 0.95 mg/dL (ref 0.61–1.24)
GFR calc Af Amer: >60 mL/min (ref >60)
GFR calc non Af Amer: >60 mL/min (ref >60)
Glucose, Bld: 78 mg/dL (ref 70–99)
Potassium: 2.8 mmol/L — ABNORMAL LOW (ref 3.5–5.1)
Sodium: 135 mmol/L (ref 135–145)
Total Bilirubin: 3 mg/dL — ABNORMAL HIGH (ref 0.3–1.2)
Total Protein: 6.4 g/dL — ABNORMAL LOW (ref 6.5–8.1)

## 2019-03-03 LAB — BASIC METABOLIC PANEL
Anion gap: 14 (ref 5–15)
Anion gap: 13 (ref 5–15)
BUN: 11 mg/dL (ref 6–20)
BUN: 12 mg/dL (ref 6–20)
CO2: 30 mmol/L (ref 22–32)
CO2: 30 mmol/L (ref 22–32)
Calcium: 8.1 mg/dL — ABNORMAL LOW (ref 8.9–10.3)
Calcium: 8 mg/dL — ABNORMAL LOW (ref 8.9–10.3)
Chloride: 90 mmol/L — ABNORMAL LOW (ref 98–111)
Chloride: 93 mmol/L — ABNORMAL LOW (ref 98–111)
Creatinine, Ser: 0.96 mg/dL (ref 0.61–1.24)
Creatinine, Ser: 0.93 mg/dL (ref 0.61–1.24)
GFR calc Af Amer: >60 mL/min (ref >60)
GFR calc Af Amer: >60 mL/min (ref >60)
GFR calc non Af Amer: >60 mL/min (ref >60)
GFR calc non Af Amer: >60 mL/min (ref >60)
Glucose, Bld: 102 mg/dL — ABNORMAL HIGH (ref 70–99)
Glucose, Bld: 71 mg/dL (ref 70–99)
Potassium: 3.1 mmol/L — ABNORMAL LOW (ref 3.5–5.1)
Potassium: 3.1 mmol/L — ABNORMAL LOW (ref 3.5–5.1)
Sodium: 134 mmol/L — ABNORMAL LOW (ref 135–145)
Sodium: 136 mmol/L (ref 135–145)

## 2019-03-03 LAB — HEMOGLOBIN A1C
Hgb A1c MFr Bld: 4 % — ABNORMAL LOW (ref 4.8–5.6)
Mean Plasma Glucose: 68.1 mg/dL

## 2019-03-03 LAB — MAGNESIUM: Magnesium: 1.3 mg/dL — ABNORMAL LOW (ref 1.7–2.4)

## 2019-03-03 LAB — LIPASE, BLOOD: Lipase: 115 U/L — ABNORMAL HIGH (ref 11–51)

## 2019-03-03 LAB — SARS CORONAVIRUS 2 (TAT 6-24 HRS): SARS Coronavirus 2: NEGATIVE

## 2019-03-03 LAB — GLUCOSE, CAPILLARY
Glucose-Capillary: 78 mg/dL (ref 70–99)
Glucose-Capillary: 86 mg/dL (ref 70–99)

## 2019-03-03 LAB — LACTIC ACID, PLASMA: Lactic Acid, Venous: 1.4 mmol/L (ref 0.5–1.9)

## 2019-03-03 MED ORDER — INFLUENZA VAC SPLIT QUAD 0.5 ML IM SUSY
0.5000 mL | PREFILLED_SYRINGE | INTRAMUSCULAR | Status: AC
  Administered 2019-03-04: 09:00:00 0.5 mL via INTRAMUSCULAR
  Filled 2019-03-03: qty 0.5

## 2019-03-03 MED ORDER — POTASSIUM CHLORIDE 10 MEQ/100ML IV SOLN
10.0000 meq | INTRAVENOUS | Status: AC
  Administered 2019-03-03 (×6): 10 meq via INTRAVENOUS
  Filled 2019-03-03 (×6): qty 100

## 2019-03-03 MED ORDER — CHLORHEXIDINE GLUCONATE CLOTH 2 % EX PADS
6.0000 | MEDICATED_PAD | Freq: Every day | CUTANEOUS | Status: DC
  Administered 2019-03-03 – 2019-03-08 (×6): 6 via TOPICAL

## 2019-03-03 NOTE — ED Notes (Signed)
Pt's sister Chastity called to check on her brother's status. RN informed the sister the Pt was resting right now, but a message would be relayed to the Pt that hi sister called and was concerned for him.

## 2019-03-03 NOTE — Progress Notes (Signed)
PROGRESS NOTE    Timothy Houston  WUX:[id number] DOB: 13-Sep-1970 DOA: 03/02/2019 PCP: Patient, No Pcp Per   Brief Narrative:  Per HPI: Timothy Houston is a 48 y.o. male with a history of alcohol abuse.  Patient seen for nausea, vomiting, abdominal pain x2 days.  He has been unable to tolerate any food or oral liquids.  His pain is worsening.  Pain was worse with attempts to consume oral food or liquids.  No palliating factors.  Patient is an alcoholic and drinks a case of beer every couple of days.  He gets the shakes when he does not have alcohol for a couple of days.  His last drink was a couple days ago.  9/26: Patient was admitted with pancreatitis and started on aggressive IV fluid hydration as well as Dilaudid for pain control.  He does not appear to have DKA, but did have severe hypokalemia as well as hypomagnesemia which is improving.  We will asked GI to help see patient.  Additionally will likely require inpatient psychiatric evaluation prior to discharge once stable.   Assessment & Plan:   Principal Problem:   Pancreatitis Active Problems:   Alcohol abuse   Type 2 diabetes mellitus without complication (HCC)   Lactic acidosis   Acute hypokalemia   Acute pancreatitis secondary to alcohol use -Continue aggressive IV fluid hydration -N.p.o. -Dilaudid for pain control -GI evaluation  Type 2 diabetes-controlled -Hemoglobin A1c 4.0% -We will hold Lantus for now maintain on SSI and monitor blood glucose -Continue n.p.o. status -Discontinue BMP every 4 hours  Alcohol abuse -Maintain on withdrawal protocol  Lactic acidosis -Currently improved  Acute hypokalemia/hypomagnesemia -Continue repletion and recheck in a.m.   DVT prophylaxis: Lovenox Code Status: Full Family Communication: None at bedside Disposition Plan: Continue treatment and management of pancreatitis and follow labs.  Appreciate GI evaluation.  Maintain on CIWA precautions.   Consultants:   GI   Procedures:   None  Antimicrobials:   None   Subjective: Patient seen and evaluated today with no new acute complaints or concerns. No acute concerns or events noted overnight.  He continues to have ongoing abdominal pain that is radiating to his back.  Denies any nausea or vomiting.  Objective: Vitals:   03/03/19 0900 03/03/19 1000 03/03/19 1027 03/03/19 1030  BP: (!) 157/102 (!) 139/100 (!) 139/100   Pulse:  97 (!) 103   Resp: 15 16  17   Temp:    98.4 F (36.9 C)  TempSrc:    Oral  SpO2:  96%  96%  Weight:      Height:        Intake/Output Summary (Last 24 hours) at 03/03/2019 1130 Last data filed at 03/03/2019 0105 Gross per 24 hour  Intake 350 ml  Output -  Net 350 ml   Filed Weights   03/02/19 1412  Weight: 88.5 kg    Examination:  General exam: Minimal distress Respiratory system: Clear to auscultation. Respiratory effort normal. Cardiovascular system: S1 & S2 heard, RRR. No JVD, murmurs, rubs, gallops or clicks. No pedal edema. Gastrointestinal system: Minimal diffuse tenderness to palpation Central nervous system: Somnolent but arousable Extremities: Symmetric 5 x 5 power. Skin: No rashes, lesions or ulcers Psychiatry: Difficult to assess.    Data Reviewed: I have personally reviewed following labs and imaging studies  CBC: Recent Labs  Lab 03/02/19 1440 03/03/19 0546  WBC 16.1* 13.0*  NEUTROABS 11.6*  --   HGB 17.4* 14.4  HCT 48.0 39.8  MCV 95.4  99.3  PLT 117* 77*   Basic Metabolic Panel: Recent Labs  Lab 03/02/19 1800 03/02/19 2116 03/02/19 2117 03/03/19 0156 03/03/19 0546 03/03/19 0958  NA 133* 133*  --  134* 135 136  K 2.5* 3.0*  --  3.1* 2.8* 3.1*  CL 87* 88*  --  90* 93* 93*  CO2 25 26  --  GLUCOSE 153* 132*  --  102* 78 71  BUN 10 10  --  CREATININE 1.05 0.85  --  0.96 0.95 0.93  CALCIUM 9.0 8.3*  --  8.1* 8.1* 8.0*  MG  --   --  0.7*  --  1.3*  --   PHOS  --   --  3.4  --   --   --    GFR:  Estimated Creatinine Clearance: 106.1 mL/min (by C-G formula based on SCr of 0.93 mg/dL). Liver Function Tests: Recent Labs  Lab 03/02/19 1440 03/03/19 0546  AST 148* 76*  ALT 75* 50*  ALKPHOS 108 73  BILITOT 3.3* 3.0*  PROT 9.0* 6.4*  ALBUMIN 5.0 3.6   Recent Labs  Lab 03/02/19 1440 03/03/19 0545  LIPASE 1,441* 115*   No results for input(s): AMMONIA in the last 168 hours. Coagulation Profile: Recent Labs  Lab 03/02/19 1613  INR 1.3*   Cardiac Enzymes: No results for input(s): CKTOTAL, CKMB, CKMBINDEX, TROPONINI in the last 168 hours. BNP (last 3 results) No results for input(s): PROBNP in the last 8760 hours. HbA1C: Recent Labs    03/02/19 1440  HGBA1C 4.0*   CBG: Recent Labs  Lab 03/02/19 1733 03/02/19 1940 03/02/19 2249 03/03/19 0602  GLUCAP 168* 139* 133* 70   Lipid Profile: No results for input(s): CHOL, HDL, LDLCALC, TRIG, CHOLHDL, LDLDIRECT in the last 72 hours. Thyroid Function Tests: No results for input(s): TSH, T4TOTAL, FREET4, T3FREE, THYROIDAB in the last 72 hours. Anemia Panel: No results for input(s): VITAMINB12, FOLATE, FERRITIN, TIBC, IRON, RETICCTPCT in the last 72 hours. Sepsis Labs: Recent Labs  Lab 03/02/19 1440 03/02/19 1613 03/03/19 0546  LATICACIDVEN 7.7* 6.5* 1.4    Recent Results (from the past 240 hour(s))  Blood Culture (routine x 2)     Status: None (Preliminary result)   Collection Time: 03/02/19  2:47 PM   Specimen: Left Antecubital; Blood  Result Value Ref Range Status   Specimen Description LEFT ANTECUBITAL  Final   Special Requests   Final    BOTTLES DRAWN AEROBIC AND ANAEROBIC Blood Culture adequate volume   Culture   Final    NO GROWTH < 24 HOURS Performed at [facility], [address] [place], [place] [address]    Report Status PENDING  Incomplete  Blood Culture (routine x 2)     Status: None (Preliminary result)   Collection Time: 03/02/19  2:49 PM   Specimen: Right Antecubital; Blood  Result Value  Ref Range Status   Specimen Description RIGHT ANTECUBITAL  Final   Special Requests   Final    BOTTLES DRAWN AEROBIC AND ANAEROBIC Blood Culture adequate volume   Culture   Final    NO GROWTH < 24 HOURS Performed at [facility], [address] [place], [place] [address]    Report Status PENDING  Incomplete  SARS CORONAVIRUS 2 (TAT 6-24 HRS) Nasopharyngeal Nasopharyngeal Swab     Status: None   Collection Time: 03/02/19  5:49 PM   Specimen: Nasopharyngeal Swab  Result Value Ref Range Status   SARS  Coronavirus 2 NEGATIVE NEGATIVE Final    Comment: (NOTE) SARS-CoV-2 target nucleic acids are NOT DETECTED. The SARS-CoV-2 RNA is generally detectable in upper and lower respiratory specimens during the acute phase of infection. Negative results do not preclude SARS-CoV-2 infection, do not rule out co-infections with other pathogens, and should not be used as the sole basis for treatment or other patient management decisions. Negative results must be combined with clinical observations, patient history, and epidemiological information. The expected result is Negative. Fact Sheet for Patients: SugarRoll.be Fact Sheet for Healthcare Providers: https://www.woods-mathews.com/ This test is not yet approved or cleared by the [place] FDA and  has been authorized for detection and/or diagnosis of SARS-CoV-2 by FDA under an Emergency Use Authorization (EUA). This EUA will remain  in effect (meaning this test can be used) for the duration of the COVID-19 declaration under Section 56 4(b)(1) of the Act, 21 U.S.C. section 360bbb-3(b)(1), unless the authorization is terminated or revoked sooner. Performed at [facility], [place] [address], [place], [place] [address]          Radiology Studies: Ct Abdomen Pelvis W Contrast  Result Date: 03/02/2019 CLINICAL DATA:  Pt c/o severe mid abdominal pain and vomiting x 2 days. Denies diarrhea. Pt is  diaphoretic^128mL OMNIPAQUE IOHEXOL 300 MG/ML SOLN EXAM: CT ABDOMEN AND PELVIS WITH CONTRAST TECHNIQUE: Multidetector CT imaging of the abdomen and pelvis was performed using the standard protocol following bolus administration of intravenous contrast. CONTRAST:  128mL OMNIPAQUE IOHEXOL 300 MG/ML  SOLN COMPARISON:  None. FINDINGS: Lower chest: Bandlike opacity at the right lung base likely reflects atelectasis. No pleural or pericardial effusion. Hepatobiliary: Diffuse hepatic steatosis. There are multiple small layering gallstones. No evidence of gallbladder wall thickening. No biliary dilation. Pancreas: Heterogeneous appearance of the pancreatic head with peripancreatic fat stranding predominantly involving the pancreatic head but extending diffusely along the body and tail most likely representing acute pancreatitis. No focal peripancreatic fluid collection. No pancreatic duct dilation. There is duodenal wall thickening in the first through fourth portions with associated fat stranding. Spleen: Normal in size without focal abnormality. Adrenals/Urinary Tract: Adrenal glands are unremarkable. Kidneys are normal, without renal calculi, focal lesion, or hydronephrosis. Bladder is unremarkable. Stomach/Bowel: Stomach is within normal limits. Appendix appears normal. Multiple mildly dilated loops of both large and small bowel favored to represent ileus. No bowel wall thickening. No free air. Vascular/Lymphatic: No significant vascular findings are present. No enlarged abdominal or pelvic lymph nodes. Reproductive: Prostate is unremarkable. Other: No abdominal wall hernia or abnormality. Musculoskeletal: Age-indeterminate mild height loss at the T11 vertebral body. IMPRESSION: 1. Heterogeneous appearance of the pancreatic head with peripancreatic fat stranding predominantly involving the pancreatic head but extending along the body and tail, most likely representing acute pancreatitis. No focal peripancreatic fluid  collection. 2. Duodenal wall thickening with inflammatory changes likely a reactive duodenitis. 3. Hepatic steatosis. 4. Cholelithiasis without evidence of cholecystitis. 5. Multiple mildly dilated loops of both large and small bowel favored to represent ileus. 6. Age-indeterminate height loss at the T11 vertebral body. Electronically Signed   By: Audie Pinto M.D.   On: 03/02/2019 16:09        Scheduled Meds: . enoxaparin (LOVENOX) injection  40 mg Subcutaneous Q24H  . folic acid  1 mg Oral Daily  . insulin aspart  0-15 Units Subcutaneous Q6H  . insulin glargine  15 Units Subcutaneous QHS  . LORazepam  0-4 mg Intravenous Q4H   Followed by  . [START ON 03/04/2019]  LORazepam  0-4 mg Intravenous Q8H  . multivitamin with minerals  1 tablet Oral Daily  . thiamine  100 mg Oral Daily   Or  . thiamine  100 mg Intravenous Daily   Continuous Infusions: . sodium chloride 1,000 mL (03/03/19 1025)  . potassium chloride 10 mEq (03/03/19 1035)     LOS: 1 day    Time spent: 30 minutes    Marka Treloar Hoover Brunette Kanoe Wanner, DO [facility] Pager [phone]  If 7PM-7AM, please contact night-coverage [email or web address] Password [email or web address] 03/03/2019, 11:30 AM

## 2019-03-03 NOTE — Progress Notes (Signed)
[facility] contacted in regards to PT blood pressure. No medications ordered at this time. No new medications to be given or ordered. Continue to monitor.

## 2019-03-03 NOTE — ED Notes (Signed)
Pt's sister reported to RN over phone her brother had almost "drank himself to death before when he was last treated at [facility]." She reported that her brother constantly drinks alcohol despite being told by [id number] physicians "he needs to stop or he will end up killing himself."

## 2019-03-03 NOTE — Consult Note (Signed)
Referring Provider: No ref. provider found Primary Care Physician:  Patient, No Pcp Per Primary Gastroenterologist:  Dr.  Jena Gauss  Reason for Consultation:  Pancreatitis  HPI: 48 year old gentleman with regular heavy consumption of beer admitted to the hospital with 3-day history of nausea, vomiting and generalized abdominal pain.  ED evaluation revealed acute interstitial pancreatitis without necrotizing component or pseudocyst on CT.  No biliary dilation.  Cholelithiasis present.  Profoundly hypokalemic and hypomagnesemic  -  now receiving supplementation.  He was aggressively hydrated on presentation and continues to receive normal saline at 200 cc/h.  Renal function remains normal. AST/ALT 148/75 on admission.  Admitting hemoglobin 17.4; now with 14.4.  Initial lipase 1441; now 115. Contrast CT also demonstrates a fatty liver normal-appearing spleen; no evidence of portal gastropathy. Patient denies hematemesis or melena.  He denies prior history of pancreatitis.   Past Medical History:  Diagnosis Date  . Ambulates with cane     History reviewed. No pertinent surgical history.  Prior to Admission medications   Not on File    Current Facility-Administered Medications  Medication Dose Route Frequency Provider Last Rate Last Dose  . 0.9 %  sodium chloride infusion  1,000 mL Intravenous Continuous Levie Heritage, DO 200 mL/hr at 03/03/19 1025 1,000 mL at 03/03/19 1025  . enoxaparin (LOVENOX) injection 40 mg  40 mg Subcutaneous Q24H Levie Heritage, DO   40 mg at 03/02/19 1937  . folic acid (FOLVITE) tablet 1 mg  1 mg Oral Daily Levie Heritage, DO   1 mg at 03/03/19 1026  . HYDROmorphone (DILAUDID) injection 1 mg  1 mg Intravenous Q3H PRN Levie Heritage, DO   1 mg at 03/03/19 1026  . insulin aspart (novoLOG) injection 0-15 Units  0-15 Units Subcutaneous Q6H Levie Heritage, DO   2 Units at 03/02/19 2319  . LORazepam (ATIVAN) injection 0-4 mg  0-4 mg Intravenous Q4H Levie Heritage, DO   1 mg at 03/03/19 1032   Followed by  . [START ON 03/04/2019] LORazepam (ATIVAN) injection 0-4 mg  0-4 mg Intravenous Q8H Levie Heritage, DO      . LORazepam (ATIVAN) tablet 1-4 mg  1-4 mg Oral Q1H PRN Levie Heritage, DO       Or  . LORazepam (ATIVAN) injection 1-4 mg  1-4 mg Intravenous Q1H PRN Levie Heritage, DO      . multivitamin with minerals tablet 1 tablet  1 tablet Oral Daily Levie Heritage, DO   1 tablet at 03/03/19 1027  . ondansetron (ZOFRAN) tablet 4 mg  4 mg Oral Q6H PRN Levie Heritage, DO       Or  . ondansetron [facility]) injection 4 mg  4 mg Intravenous Q6H PRN Levie Heritage, DO   4 mg at 03/02/19 1938  . potassium chloride 10 mEq in 100 mL IVPB  10 mEq Intravenous Q1 Hr x 6 Maurilio Lovely D, DO 100 mL/hr at 03/03/19 1035 10 mEq at 03/03/19 1035  . thiamine (VITAMIN B-1) tablet 100 mg  100 mg Oral Daily Levie Heritage, DO   100 mg at 03/03/19 1026   Or  . thiamine (B-1) injection 100 mg  100 mg Intravenous Daily Levie Heritage, DO   100 mg at 03/02/19 1937   No current outpatient medications on file.    Allergies as of 03/02/2019  . (No Known Allergies)    No family history on file.  Social History  Socioeconomic History  . Marital status: Single    Spouse name: Not on file  . Number of children: Not on file  . Years of education: Not on file  . Highest education level: Not on file  Occupational History  . Not on file  Social Needs  . Financial resource strain: Not on file  . Food insecurity    Worry: Not on file    Inability: Not on file  . Transportation needs    Medical: Not on file    Non-medical: Not on file  Tobacco Use  . Smoking status: Current Every Day Smoker    Packs/day: 0.50    Types: Cigarettes  . Smokeless tobacco: Never Used  Substance and Sexual Activity  . Alcohol use: Yes    Comment: on the weekends   . Drug use: Yes    Types: Marijuana  . Sexual activity: Not on file  Lifestyle  . Physical activity     Days per week: Not on file    Minutes per session: Not on file  . Stress: Not on file  Relationships  . Social Musicianconnections    Talks on phone: Not on file    Gets together: Not on file    Attends religious service: Not on file    Active member of club or organization: Not on file    Attends meetings of clubs or organizations: Not on file    Relationship status: Not on file  . Intimate partner violence    Fear of current or ex partner: Not on file    Emotionally abused: Not on file    Physically abused: Not on file    Forced sexual activity: Not on file  Other Topics Concern  . Not on file  Social History Narrative  . Not on file    Review of Systems:  As in history of present illness   Physical Exam: Vital signs in last 24 hours: Temp:  [97.9 F (36.6 C)-100.6 F (38.1 C)] 98.4 F (36.9 C) (09/26 1030) Pulse Rate:  [92-144] 103 (09/26 1027) Resp:  [13-23] 17 (09/26 1030) BP: (139-181)/(91-123) 139/100 (09/26 1027) SpO2:  [92 %-100 %] 96 % (09/26 1030) Weight:  [88.5 kg] 88.5 kg (09/25 1412)   General:   Alert, disheveled.  Somewhat agitated.  Otherwise conversant and cooperative.   Eyes:  Sclera clear, no icterus.   Conjunctiva pink. Lungs:  Clear throughout to auscultation.   No wheezes, crackles, or rhonchi. No acute distress. Heart:  Regular rate and rhythm; no murmurs, clicks, rubs,  or gallops. Abdomen: Nondistended.  Diminished bowel sounds.  Diffuse tenderness to palpation without appreciable mass or hepatosplenomegaly.      Intake/Output from previous day: 09/25 0701 - 09/26 0700 In: 350 [IV Piggyback:350] Out: -  Intake/Output this shift: No intake/output data recorded.  Lab Results: Recent Labs    03/02/19 1440 03/03/19 0546  WBC 16.1* 13.0*  HGB 17.4* 14.4  HCT 48.0 39.8  PLT 117* 77*   BMET Recent Labs    03/03/19 0156 03/03/19 0546 03/03/19 0958  NA 134* 135 136  K 3.1* 2.8* 3.1*  CL 90* 93* 93*  CO2 30 29 30   GLUCOSE 102* 78 71   BUN 11 11 12   CREATININE 0.96 0.95 0.93  CALCIUM 8.1* 8.1* 8.0*   LFT Recent Labs    03/03/19 0546  PROT 6.4*  ALBUMIN 3.6  AST 76*  ALT 50*  ALKPHOS 73  BILITOT 3.0*   PT/INR Recent Labs  03/02/19 1613  LABPROT 15.9*  INR 1.3*   Hepatitis Panel No results for input(s): HEPBSAG, HCVAB, HEPAIGM, HEPBIGM in the last 72 hours. C-Diff No results for input(s): CDIFFTOX in the last 72 hours.  Studies/Results: Ct Abdomen Pelvis W Contrast  Result Date: 03/02/2019 CLINICAL DATA:  Pt c/o severe mid abdominal pain and vomiting x 2 days. Denies diarrhea. Pt is diaphoretic^150mL OMNIPAQUE IOHEXOL 300 MG/ML SOLN EXAM: CT ABDOMEN AND PELVIS WITH CONTRAST TECHNIQUE: Multidetector CT imaging of the abdomen and pelvis was performed using the standard protocol following bolus administration of intravenous contrast. CONTRAST:  152mL OMNIPAQUE IOHEXOL 300 MG/ML  SOLN COMPARISON:  None. FINDINGS: Lower chest: Bandlike opacity at the right lung base likely reflects atelectasis. No pleural or pericardial effusion. Hepatobiliary: Diffuse hepatic steatosis. There are multiple small layering gallstones. No evidence of gallbladder wall thickening. No biliary dilation. Pancreas: Heterogeneous appearance of the pancreatic head with peripancreatic fat stranding predominantly involving the pancreatic head but extending diffusely along the body and tail most likely representing acute pancreatitis. No focal peripancreatic fluid collection. No pancreatic duct dilation. There is duodenal wall thickening in the first through fourth portions with associated fat stranding. Spleen: Normal in size without focal abnormality. Adrenals/Urinary Tract: Adrenal glands are unremarkable. Kidneys are normal, without renal calculi, focal lesion, or hydronephrosis. Bladder is unremarkable. Stomach/Bowel: Stomach is within normal limits. Appendix appears normal. Multiple mildly dilated loops of both large and small bowel favored to  represent ileus. No bowel wall thickening. No free air. Vascular/Lymphatic: No significant vascular findings are present. No enlarged abdominal or pelvic lymph nodes. Reproductive: Prostate is unremarkable. Other: No abdominal wall hernia or abnormality. Musculoskeletal: Age-indeterminate mild height loss at the T11 vertebral body. IMPRESSION: 1. Heterogeneous appearance of the pancreatic head with peripancreatic fat stranding predominantly involving the pancreatic head but extending along the body and tail, most likely representing acute pancreatitis. No focal peripancreatic fluid collection. 2. Duodenal wall thickening with inflammatory changes likely a reactive duodenitis. 3. Hepatic steatosis. 4. Cholelithiasis without evidence of cholecystitis. 5. Multiple mildly dilated loops of both large and small bowel favored to represent ileus. 6. Age-indeterminate height loss at the T11 vertebral body. Electronically Signed   By: Audie Pinto M.D.   On: 03/02/2019 16:09    Impression: 48 year old male alcoholic admitted with acute interstitial pancreatitis along with secondary profound hypokalemia, hypomagnesemia ileus.. Coincidental alcoholic hepatitis and cholelithiasis.  At this point time, he has been aggressively hydrated.  Electrolytes are being repleted. Pancreatitis likely secondary to alcohol and appears to be uncomplicated thus far.  Renal function remains normal.  Thrombocytopenia more likely related to alcoholic bone marrow suppression rather than advanced liver disease (which I doubt is present) Recommendations:  Continue present course of support with n.p.o., IV fluids and electrolyte support.  Cover for withdrawal symptoms as is being done.  Agree with Ativan for agitation.  Address alcohol abstinence further once he is over this acute illness.  Further recommendations to follow.         Notice:  This dictation was prepared with Dragon dictation along with smaller phrase  technology. Any transcriptional errors that result from this process are unintentional and may not be corrected upon review.

## 2019-03-04 LAB — COMPREHENSIVE METABOLIC PANEL
ALT: 41 U/L (ref 0–44)
AST: 73 U/L — ABNORMAL HIGH (ref 15–41)
Albumin: 3 g/dL — ABNORMAL LOW (ref 3.5–5.0)
Alkaline Phosphatase: 70 U/L (ref 38–126)
Anion gap: 7 (ref 5–15)
BUN: 12 mg/dL (ref 6–20)
CO2: 28 mmol/L (ref 22–32)
Calcium: 7.3 mg/dL — ABNORMAL LOW (ref 8.9–10.3)
Chloride: 99 mmol/L (ref 98–111)
Creatinine, Ser: 0.77 mg/dL (ref 0.61–1.24)
GFR calc Af Amer: >60 mL/min (ref >60)
GFR calc non Af Amer: >60 mL/min (ref >60)
Glucose, Bld: 77 mg/dL (ref 70–99)
Potassium: 3.4 mmol/L — ABNORMAL LOW (ref 3.5–5.1)
Sodium: 134 mmol/L — ABNORMAL LOW (ref 135–145)
Total Bilirubin: 2.5 mg/dL — ABNORMAL HIGH (ref 0.3–1.2)
Total Protein: 5.6 g/dL — ABNORMAL LOW (ref 6.5–8.1)

## 2019-03-04 LAB — URINE CULTURE: Culture: <10000 — AB

## 2019-03-04 LAB — CBC
HCT: 33.1 % — ABNORMAL LOW (ref 39.0–52.0)
Hemoglobin: 11 g/dL — ABNORMAL LOW (ref 13.0–17.0)
MCH: 35.1 pg — ABNORMAL HIGH (ref 26.0–34.0)
MCHC: 33.2 g/dL (ref 30.0–36.0)
MCV: 105.8 fL — ABNORMAL HIGH (ref 80.0–100.0)
Platelets: 62 10*3/uL — ABNORMAL LOW (ref 150–400)
RBC: 3.13 MIL/uL — ABNORMAL LOW (ref 4.22–5.81)
RDW: 12.6 % (ref 11.5–15.5)
WBC: 6.1 10*3/uL (ref 4.0–10.5)
nRBC: 0 % (ref 0.0–0.2)

## 2019-03-04 LAB — LIPASE, BLOOD: Lipase: 42 U/L (ref 11–51)

## 2019-03-04 LAB — HIV ANTIBODY (ROUTINE TESTING W REFLEX): HIV Screen 4th Generation wRfx: NONREACTIVE

## 2019-03-04 LAB — GLUCOSE, CAPILLARY
Glucose-Capillary: 71 mg/dL (ref 70–99)
Glucose-Capillary: 80 mg/dL (ref 70–99)
Glucose-Capillary: 86 mg/dL (ref 70–99)
Glucose-Capillary: 97 mg/dL (ref 70–99)

## 2019-03-04 LAB — FERRITIN: Ferritin: 876 ng/mL — ABNORMAL HIGH (ref 24–336)

## 2019-03-04 LAB — MRSA PCR SCREENING: MRSA by PCR: NEGATIVE

## 2019-03-04 LAB — IRON AND TIBC
Iron: 59 ug/dL (ref 45–182)
Saturation Ratios: 27 % (ref 17.9–39.5)
TIBC: 216 ug/dL — ABNORMAL LOW (ref 250–450)
UIBC: 157 ug/dL

## 2019-03-04 LAB — RETICULOCYTES
Immature Retic Fract: 14.9 % (ref 2.3–15.9)
RBC.: 3.17 MIL/uL — ABNORMAL LOW (ref 4.22–5.81)
Retic Count, Absolute: 51.7 10*3/uL (ref 19.0–186.0)
Retic Ct Pct: 1.6 % (ref 0.4–3.1)

## 2019-03-04 LAB — MAGNESIUM: Magnesium: 1.2 mg/dL — ABNORMAL LOW (ref 1.7–2.4)

## 2019-03-04 LAB — VITAMIN B12: Vitamin B-12: 559 pg/mL (ref 180–914)

## 2019-03-04 LAB — FOLATE: Folate: 5.5 ng/mL — ABNORMAL LOW (ref >5.9)

## 2019-03-04 MED ORDER — DEXMEDETOMIDINE HCL IN NACL 200 MCG/50ML IV SOLN
0.2000 ug/kg/h | INTRAVENOUS | Status: DC
  Administered 2019-03-04 (×3): 0.7 ug/kg/h via INTRAVENOUS
  Administered 2019-03-04: 0.2 ug/kg/h via INTRAVENOUS
  Administered 2019-03-05: 13:00:00 0.7 ug/kg/h via INTRAVENOUS
  Administered 2019-03-05: 08:00:00 0.5 ug/kg/h via INTRAVENOUS
  Administered 2019-03-05 (×3): 0.7 ug/kg/h via INTRAVENOUS
  Administered 2019-03-05: 03:00:00 0.5 ug/kg/h via INTRAVENOUS
  Administered 2019-03-06 – 2019-03-07 (×10): 0.7 ug/kg/h via INTRAVENOUS
  Filled 2019-03-04: qty 150
  Filled 2019-03-04 (×5): qty 50
  Filled 2019-03-04 (×3): qty 100
  Filled 2019-03-04: qty 50
  Filled 2019-03-04: qty 100
  Filled 2019-03-04 (×2): qty 50

## 2019-03-04 MED ORDER — POTASSIUM CHLORIDE CRYS ER 20 MEQ PO TBCR
40.0000 meq | EXTENDED_RELEASE_TABLET | Freq: Once | ORAL | Status: AC
  Administered 2019-03-04: 10:00:00 40 meq via ORAL
  Filled 2019-03-04: qty 2

## 2019-03-04 MED ORDER — MAGNESIUM SULFATE 2 GM/50ML IV SOLN
2.0000 g | Freq: Once | INTRAVENOUS | Status: AC
  Administered 2019-03-04: 2 g via INTRAVENOUS
  Filled 2019-03-04: qty 50

## 2019-03-04 MED ORDER — HYDRALAZINE HCL 20 MG/ML IJ SOLN
10.0000 mg | INTRAMUSCULAR | Status: DC | PRN
  Administered 2019-03-04 – 2019-03-06 (×6): 10 mg via INTRAVENOUS
  Filled 2019-03-04 (×6): qty 1

## 2019-03-04 NOTE — Progress Notes (Signed)
PROGRESS NOTE    Timothy Houston  WUJ:[id number] DOB: 1971-02-03 DOA: 03/02/2019 PCP: Patient, No Pcp Per   Brief Narrative:  Per HPI: Timothy Houston a 48 y.o.malewith a history of alcohol abuse. Patient seen for nausea, vomiting, abdominal pain x2 days. He has been unable to tolerate any food or oral liquids. His pain is worsening. Pain was worse with attempts to consume oral food or liquids. No palliating factors. Patient is an alcoholic and drinks a case of beer every couple of days. He gets the shakes when he does not have alcohol for a couple of days. His last drink was a couple days ago.  9/26: Patient was admitted with pancreatitis and started on aggressive IV fluid hydration as well as Dilaudid for pain control.  He does not appear to have DKA, but did have severe hypokalemia as well as hypomagnesemia which is improving.  We will asked GI to help see patient.  Additionally will likely require inpatient psychiatric evaluation prior to discharge once stable.   9/27: Patient continues to have severe abdominal pain and has persistent hypokalemia and hypomagnesemia which will be repleted.  Laboratory data trending in the right direction.  No acute overnight events noted.  He continues to have elevated blood pressure readings.  He is stable for transfer to telemetry.  Assessment & Plan:   Principal Problem:   Pancreatitis Active Problems:   Alcohol abuse   Type 2 diabetes mellitus without complication (HCC)   Lactic acidosis   Acute hypokalemia   Acute pancreatitis secondary to alcohol use -Continue aggressive IV fluid hydration -N.p.o. -Dilaudid for pain control -GI evaluation appreciated -Transfer to telemetry today  Type 2 diabetes-controlled -Hemoglobin A1c 4.0% -We will hold Lantus for now maintain on SSI and monitor blood glucose -Continue n.p.o. status  Alcohol abuse -Maintain on withdrawal protocol  Lactic acidosis -Currently improved  Acute  hypokalemia/hypomagnesemia -Continue repletion and recheck in a.m. -Replete further today with oral potassium and IV magnesium  Anemia/thrombocytopenia -We will evaluate further with anemia panel as patient has some macrocytosis -No overt bleeding identified -Repeat CBC in a.m. -Thrombocytopenia likely related to alcohol use; avoid Lovenox for now and switch to SCDs   DVT prophylaxis: Lovenox to SCDs due to low platelet count Code Status: Full Family Communication: None at bedside Disposition Plan: Continue treatment and management of pancreatitis and follow labs.  Appreciate GI evaluation.  Maintain on CIWA precautions.  Transfer to telemetry today.   Consultants:   GI  Procedures:   None  Antimicrobials:   None  Subjective: Patient seen and evaluated today with ongoing hypertension as well as abdominal pain.  No nausea or vomiting noted.  Objective: Vitals:   03/04/19 0600 03/04/19 0700 03/04/19 0753 03/04/19 0755  BP: (!) 154/98 (!) 166/102    Pulse: 100 (!) 111 100 (!) 115  Resp: 14 (!) Temp:    98.3 F (36.8 C)  TempSrc:    Oral  SpO2: 93% 97% 98% 99%  Weight:      Height:        Intake/Output Summary (Last 24 hours) at 03/04/2019 0904 Last data filed at 03/04/2019 0754 Gross per 24 hour  Intake 6408.82 ml  Output 850 ml  Net 5558.82 ml   Filed Weights   03/02/19 1412 03/03/19 1300  Weight: 88.5 kg 88 kg    Examination:  General exam: Appears to be in minimal distress Respiratory system: Clear to auscultation. Respiratory effort normal. Cardiovascular system: S1 & S2 heard,  RRR. No JVD, murmurs, rubs, gallops or clicks. No pedal edema. Gastrointestinal system: Abdomen is nondistended, soft and nontender. No organomegaly or masses felt. Normal bowel sounds heard. Central nervous system: Alert and awake. Extremities: Symmetric 5 x 5 power. Skin: No rashes, lesions or ulcers Psychiatry: Difficult to assess.    Data Reviewed: I  have personally reviewed following labs and imaging studies  CBC: Recent Labs  Lab 03/02/19 1440 03/03/19 0546 03/04/19 0518  WBC 16.1* 13.0* 6.1  NEUTROABS 11.6*  --   --   HGB 17.4* 14.4 11.0*  HCT 48.0 39.8 33.1*  MCV 95.4 99.3 105.8*  PLT 117* 77* 62*   Basic Metabolic Panel: Recent Labs  Lab 03/02/19 2116 03/02/19 2117 03/03/19 0156 03/03/19 0546 03/03/19 0958 03/04/19 0518  NA 133*  --  134* 135 136 134*  K 3.0*  --  3.1* 2.8* 3.1* 3.4*  CL 88*  --  90* 93* 93* 99  CO2 26  --  30 29 30 28   GLUCOSE 132*  --  102* 78 71 77  BUN 10  --  11 11 12 12   CREATININE 0.85  --  0.96 0.95 0.93 0.77  CALCIUM 8.3*  --  8.1* 8.1* 8.0* 7.3*  MG  --  0.7*  --  1.3*  --  1.2*  PHOS  --  3.4  --   --   --   --    GFR: Estimated Creatinine Clearance: 123 mL/min (by C-G formula based on SCr of 0.77 mg/dL). Liver Function Tests: Recent Labs  Lab 03/02/19 1440 03/03/19 0546 03/04/19 0518  AST 148* 76* 73*  ALT 75* 50* 41  ALKPHOS 108 73 70  BILITOT 3.3* 3.0* 2.5*  PROT 9.0* 6.4* 5.6*  ALBUMIN 5.0 3.6 3.0*   Recent Labs  Lab 03/02/19 1440 03/03/19 0545 03/04/19 0518  LIPASE 1,441* 115* 42   No results for input(s): AMMONIA in the last 168 hours. Coagulation Profile: Recent Labs  Lab 03/02/19 1613  INR 1.3*   Cardiac Enzymes: No results for input(s): CKTOTAL, CKMB, CKMBINDEX, TROPONINI in the last 168 hours. BNP (last 3 results) No results for input(s): PROBNP in the last 8760 hours. HbA1C: Recent Labs    03/02/19 1440  HGBA1C 4.0*   CBG: Recent Labs  Lab 03/03/19 0602 03/03/19 1238 03/03/19 1604 03/03/19 2356 03/04/19 0522  GLUCAP 70 73 78 86 71   Lipid Profile: No results for input(s): CHOL, HDL, LDLCALC, TRIG, CHOLHDL, LDLDIRECT in the last 72 hours. Thyroid Function Tests: No results for input(s): TSH, T4TOTAL, FREET4, T3FREE, THYROIDAB in the last 72 hours. Anemia Panel: No results for input(s): VITAMINB12, FOLATE, FERRITIN, TIBC, IRON,  RETICCTPCT in the last 72 hours. Sepsis Labs: Recent Labs  Lab 03/02/19 1440 03/02/19 1613 03/03/19 0546  LATICACIDVEN 7.7* 6.5* 1.4    Recent Results (from the past 240 hour(s))  Blood Culture (routine x 2)     Status: None (Preliminary result)   Collection Time: 03/02/19  2:47 PM   Specimen: Left Antecubital; Blood  Result Value Ref Range Status   Specimen Description LEFT ANTECUBITAL  Final   Special Requests   Final    BOTTLES DRAWN AEROBIC AND ANAEROBIC Blood Culture adequate volume   Culture   Final    NO GROWTH < 24 HOURS Performed at [facility], [address] [place], [place] [address]    Report Status PENDING  Incomplete  Blood Culture (routine x 2)     Status: None (Preliminary result)  Collection Time: 03/02/19  2:49 PM   Specimen: Right Antecubital; Blood  Result Value Ref Range Status   Specimen Description RIGHT ANTECUBITAL  Final   Special Requests   Final    BOTTLES DRAWN AEROBIC AND ANAEROBIC Blood Culture adequate volume   Culture   Final    NO GROWTH < 24 HOURS Performed at [facility], [address], [place], [place] [address]    Report Status PENDING  Incomplete  Urine culture     Status: Abnormal   Collection Time: 03/02/19  4:20 PM   Specimen: Urine, Catheterized  Result Value Ref Range Status   Specimen Description   Final    URINE, CATHETERIZED Performed at [facility], [address] [place], [place] [address]    Special Requests   Final    NONE Performed at [facility], [address] [place], [place] [address]    Culture (A)  Final    <10,000 COLONIES/mL INSIGNIFICANT GROWTH Performed at [facility], [address] [place], [place] [address]    Report Status 03/04/2019 FINAL  Final  SARS CORONAVIRUS 2 (TAT 6-24 HRS) Nasopharyngeal Nasopharyngeal Swab     Status: None   Collection Time: 03/02/19  5:49 PM   Specimen: Nasopharyngeal Swab  Result Value Ref Range Status   SARS Coronavirus 2 NEGATIVE NEGATIVE Final     Comment: (NOTE) SARS-CoV-2 target nucleic acids are NOT DETECTED. The SARS-CoV-2 RNA is generally detectable in upper and lower respiratory specimens during the acute phase of infection. Negative results do not preclude SARS-CoV-2 infection, do not rule out co-infections with other pathogens, and should not be used as the sole basis for treatment or other patient management decisions. Negative results must be combined with clinical observations, patient history, and epidemiological information. The expected result is Negative. Fact Sheet for Patients: HairSlick.no Fact Sheet for Healthcare Providers: quierodirigir.com This test is not yet approved or cleared by the [place] FDA and  has been authorized for detection and/or diagnosis of SARS-CoV-2 by FDA under an Emergency Use Authorization (EUA). This EUA will remain  in effect (meaning this test can be used) for the duration of the COVID-19 declaration under Section 56 4(b)(1) of the Act, 21 U.S.C. section 360bbb-3(b)(1), unless the authorization is terminated or revoked sooner. Performed at [facility], [address], [place], [place] [address]   MRSA PCR Screening     Status: None   Collection Time: 03/03/19  1:12 PM   Specimen: Nasal Mucosa; Nasopharyngeal  Result Value Ref Range Status   MRSA by PCR NEGATIVE NEGATIVE Final    Comment:        The GeneXpert MRSA Assay (FDA approved for NASAL specimens only), is one component of a comprehensive MRSA colonization surveillance program. It is not intended to diagnose MRSA infection nor to guide or monitor treatment for MRSA infections. Performed at [facility], [address], [place], [place] [address]          Radiology Studies: Ct Abdomen Pelvis W Contrast  Result Date: 03/02/2019 CLINICAL DATA:  Pt c/o severe mid abdominal pain and vomiting x 2 days. Denies diarrhea. Pt is diaphoretic^131mL  OMNIPAQUE IOHEXOL 300 MG/ML SOLN EXAM: CT ABDOMEN AND PELVIS WITH CONTRAST TECHNIQUE: Multidetector CT imaging of the abdomen and pelvis was performed using the standard protocol following bolus administration of intravenous contrast. CONTRAST:  OMNIPAQUE IOHEXOL 300 MG/ML  SOLN COMPARISON:  None. FINDINGS: Lower chest: Bandlike opacity at the right lung base likely  reflects atelectasis. No pleural or pericardial effusion. Hepatobiliary: Diffuse hepatic steatosis. There are multiple small layering gallstones. No evidence of gallbladder wall thickening. No biliary dilation. Pancreas: Heterogeneous appearance of the pancreatic head with peripancreatic fat stranding predominantly involving the pancreatic head but extending diffusely along the body and tail most likely representing acute pancreatitis. No focal peripancreatic fluid collection. No pancreatic duct dilation. There is duodenal wall thickening in the first through fourth portions with associated fat stranding. Spleen: Normal in size without focal abnormality. Adrenals/Urinary Tract: Adrenal glands are unremarkable. Kidneys are normal, without renal calculi, focal lesion, or hydronephrosis. Bladder is unremarkable. Stomach/Bowel: Stomach is within normal limits. Appendix appears normal. Multiple mildly dilated loops of both large and small bowel favored to represent ileus. No bowel wall thickening. No free air. Vascular/Lymphatic: No significant vascular findings are present. No enlarged abdominal or pelvic lymph nodes. Reproductive: Prostate is unremarkable. Other: No abdominal wall hernia or abnormality. Musculoskeletal: Age-indeterminate mild height loss at the T11 vertebral body. IMPRESSION: 1. Heterogeneous appearance of the pancreatic head with peripancreatic fat stranding predominantly involving the pancreatic head but extending along the body and tail, most likely representing acute pancreatitis. No focal peripancreatic fluid collection. 2.  Duodenal wall thickening with inflammatory changes likely a reactive duodenitis. 3. Hepatic steatosis. 4. Cholelithiasis without evidence of cholecystitis. 5. Multiple mildly dilated loops of both large and small bowel favored to represent ileus. 6. Age-indeterminate height loss at the T11 vertebral body. Electronically Signed   By: Emmaline KluverNancy  Ballantyne M.D.   On: 03/02/2019 16:09        Scheduled Meds: . Chlorhexidine Gluconate Cloth  6 each Topical Daily  . folic acid  1 mg Oral Daily  . insulin aspart  0-15 Units Subcutaneous Q6H  . multivitamin with minerals  1 tablet Oral Daily  . potassium chloride  40 mEq Oral Once  . thiamine  100 mg Oral Daily   Or  . thiamine  100 mg Intravenous Daily   Continuous Infusions: . sodium chloride 200 mL/hr at 03/04/19 0754  . magnesium sulfate bolus IVPB       LOS: 2 days    Time spent: 30 minutes    Pratik Hoover BrunetteD Shah, DO [facility] Pager [phone]  If 7PM-7AM, please contact night-coverage [email or web address] Password [email or web address] 03/04/2019, 9:04 AM

## 2019-03-05 DIAGNOSIS — K852 Alcohol induced acute pancreatitis without necrosis or infection: Principal | ICD-10-CM

## 2019-03-05 LAB — COMPREHENSIVE METABOLIC PANEL
ALT: 38 U/L (ref 0–44)
AST: 69 U/L — ABNORMAL HIGH (ref 15–41)
Albumin: 2.9 g/dL — ABNORMAL LOW (ref 3.5–5.0)
Alkaline Phosphatase: 87 U/L (ref 38–126)
Anion gap: 12 (ref 5–15)
BUN: 8 mg/dL (ref 6–20)
CO2: 20 mmol/L — ABNORMAL LOW (ref 22–32)
Calcium: 7.6 mg/dL — ABNORMAL LOW (ref 8.9–10.3)
Chloride: 102 mmol/L (ref 98–111)
Creatinine, Ser: 0.71 mg/dL (ref 0.61–1.24)
GFR calc Af Amer: >60 mL/min (ref >60)
GFR calc non Af Amer: >60 mL/min (ref >60)
Glucose, Bld: 88 mg/dL (ref 70–99)
Potassium: 3.3 mmol/L — ABNORMAL LOW (ref 3.5–5.1)
Sodium: 134 mmol/L — ABNORMAL LOW (ref 135–145)
Total Bilirubin: 2.3 mg/dL — ABNORMAL HIGH (ref 0.3–1.2)
Total Protein: 5.7 g/dL — ABNORMAL LOW (ref 6.5–8.1)

## 2019-03-05 LAB — CBC
HCT: 34.8 % — ABNORMAL LOW (ref 39.0–52.0)
Hemoglobin: 11.8 g/dL — ABNORMAL LOW (ref 13.0–17.0)
MCH: 35.2 pg — ABNORMAL HIGH (ref 26.0–34.0)
MCHC: 33.9 g/dL (ref 30.0–36.0)
MCV: 103.9 fL — ABNORMAL HIGH (ref 80.0–100.0)
Platelets: 83 10*3/uL — ABNORMAL LOW (ref 150–400)
RBC: 3.35 MIL/uL — ABNORMAL LOW (ref 4.22–5.81)
RDW: 12.2 % (ref 11.5–15.5)
WBC: 4.9 10*3/uL (ref 4.0–10.5)
nRBC: 0 % (ref 0.0–0.2)

## 2019-03-05 LAB — MAGNESIUM: Magnesium: 1.4 mg/dL — ABNORMAL LOW (ref 1.7–2.4)

## 2019-03-05 LAB — GLUCOSE, CAPILLARY
Glucose-Capillary: 90 mg/dL (ref 70–99)
Glucose-Capillary: 85 mg/dL (ref 70–99)
Glucose-Capillary: 87 mg/dL (ref 70–99)
Glucose-Capillary: 90 mg/dL (ref 70–99)

## 2019-03-05 MED ORDER — LORAZEPAM 2 MG/ML IJ SOLN
1.0000 mg | INTRAMUSCULAR | Status: DC | PRN
  Administered 2019-03-05 – 2019-03-06 (×3): 2 mg via INTRAVENOUS
  Administered 2019-03-06: 20:00:00 3 mg via INTRAVENOUS
  Administered 2019-03-07: 2 mg via INTRAVENOUS
  Filled 2019-03-05 (×3): qty 1
  Filled 2019-03-05: qty 2
  Filled 2019-03-05: qty 1

## 2019-03-05 MED ORDER — LORAZEPAM 1 MG PO TABS: 1.0000 mg | ORAL_TABLET | ORAL | Status: DC | PRN

## 2019-03-05 MED ORDER — AMLODIPINE BESYLATE 5 MG PO TABS
5.0000 mg | ORAL_TABLET | Freq: Every day | ORAL | Status: DC
  Administered 2019-03-05 – 2019-03-08 (×4): 5 mg via ORAL
  Filled 2019-03-05 (×4): qty 1

## 2019-03-05 MED ORDER — POTASSIUM CHLORIDE CRYS ER 20 MEQ PO TBCR
40.0000 meq | EXTENDED_RELEASE_TABLET | Freq: Three times a day (TID) | ORAL | Status: AC
  Administered 2019-03-05 (×3): 40 meq via ORAL
  Filled 2019-03-05 (×3): qty 2

## 2019-03-05 MED ORDER — MAGNESIUM SULFATE 2 GM/50ML IV SOLN
2.0000 g | Freq: Once | INTRAVENOUS | Status: AC
  Administered 2019-03-05: 08:00:00 2 g via INTRAVENOUS
  Filled 2019-03-05: qty 50

## 2019-03-05 MED ORDER — NICOTINE 7 MG/24HR TD PT24
7.0000 mg | MEDICATED_PATCH | Freq: Every day | TRANSDERMAL | Status: DC
  Administered 2019-03-05 – 2019-03-08 (×4): 7 mg via TRANSDERMAL
  Filled 2019-03-05 (×4): qty 1

## 2019-03-05 NOTE — Progress Notes (Signed)
PROGRESS NOTE    Timothy Houston  MBT:[id number] DOB: 1970-06-16 DOA: 03/02/2019 PCP: Patient, No Pcp Per   Brief Narrative:  Per HPI: Timothy Houston a 48 y.o.malewith a history of alcohol abuse. Patient seen for nausea, vomiting, abdominal pain x2 days. He has been unable to tolerate any food or oral liquids. His pain is worsening. Pain was worse with attempts to consume oral food or liquids. No palliating factors. Patient is an alcoholic and drinks a case of beer every couple of days. He gets the shakes when he does not have alcohol for a couple of days. His last drink was a couple days ago.  9/26:Patient was admitted with pancreatitis and started on aggressive IV fluid hydration as well as Dilaudid for pain control. He does not appear to have DKA, but did have severe hypokalemia as well as hypomagnesemia which is improving. We will asked GI to help see patient. Additionally will likely require inpatient psychiatric evaluation prior to discharge once stable.   9/27: Patient continues to have severe abdominal pain and has persistent hypokalemia and hypomagnesemia which will be repleted.  Laboratory data trending in the right direction.  No acute overnight events noted.  He continues to have elevated blood pressure readings.  He is stable for transfer to telemetry.  9/28: Patient continues to have significant abdominal pain along with hypertension.  He has been started on Precedex drip overnight and appears to have rested comfortably.  Assessment & Plan:   Principal Problem:   Pancreatitis Active Problems:   Alcohol abuse   Type 2 diabetes mellitus without complication (HCC)   Lactic acidosis   Acute hypokalemia   Acute pancreatitis secondary to alcohol use -Continue aggressive IV fluid hydration -N.p.o. -Dilaudid for pain control -GI ongoing evaluation appreciated  Type 2 diabetes-controlled -Hemoglobin A1c 4.0% -We will hold Lantus for now maintain on SSI and  monitor blood glucose -Continue n.p.o. status  Alcohol abuse with acute DT -Maintain on Precedex drip started 9/27 and continue careful monitoring in stepdown unit  Hypertensive crisis related to DT -Maintain on Precedex drip -Hydralazine as needed for severe elevations -Start amlodipine 5 mg daily today  Lactic acidosis -Currently improved  Acute hypokalemia/hypomagnesemia -Continue repletion and recheck in a.m. -Replete further today with oral potassium and IV magnesium  Anemia/thrombocytopenia-stable -We will evaluate further with anemia panel as patient has some macrocytosis -No overt bleeding identified -Repeat CBC in a.m. -Thrombocytopenia likely related to alcohol use; avoid Lovenox for now and switch to SCDs -Patient noted to have low folate levels and will plan to supplement   DVT prophylaxis:SCDs Code Status:Full Family Communication:None at bedside, will plan to call friend that is listed for update Disposition Plan:Continue treatment and management of pancreatitis and follow labs. Appreciate GI evaluation. Maintain on CIWA precautions with Precedex drip now due to DTs.  Add amlodipine for blood pressure control.  Continue monitoring in stepdown unit.   Consultants:  GI  Procedures:  None  Antimicrobials:   None  Subjective: Patient seen and evaluated today with signs and symptoms of DT for which Precedex was started yesterday.  He continues to have some restlessness and ongoing pain as well as hypertension.  Objective: Vitals:   03/05/19 0500 03/05/19 0530 03/05/19 0600 03/05/19 0630  BP: (!) 170/98 (!) 163/99 (!) 144/98 (!) 184/108  Pulse: 65 66 76 62  Resp: (!) 25 (!) 23 17 (!) 26  Temp:      TempSrc:      SpO2: 91% 92% 92% 94%  Weight:      Height:        Intake/Output Summary (Last 24 hours) at 03/05/2019 0712 Last data filed at 03/05/2019 0518 Gross per 24 hour  Intake 4641.55 ml  Output 3850 ml  Net 791.55 ml   Filed  Weights   03/02/19 1412 03/03/19 1300 03/05/19 0400  Weight: 88.5 kg 88 kg 81.4 kg    Examination:  General exam: Appears in minimal distress Respiratory system: Clear to auscultation. Respiratory effort normal. Cardiovascular system: S1 & S2 heard, RRR. No JVD, murmurs, rubs, gallops or clicks. No pedal edema. Gastrointestinal system: Abdomen is nondistended, soft and nontender. No organomegaly or masses felt. Normal bowel sounds heard. Central nervous system: Somnolent but arousable Extremities: No significant edema Skin: No rashes, lesions or ulcers Psychiatry: Cannot be evaluated.    Data Reviewed: I have personally reviewed following labs and imaging studies  CBC: Recent Labs  Lab 03/02/19 1440 03/03/19 0546 03/04/19 0518 03/05/19 0410  WBC 16.1* 13.0* 6.1 4.9  NEUTROABS 11.6*  --   --   --   HGB 17.4* 14.4 11.0* 11.8*  HCT 48.0 39.8 33.1* 34.8*  MCV 95.4 99.3 105.8* 103.9*  PLT 117* 77* 62* 83*   Basic Metabolic Panel: Recent Labs  Lab 03/02/19 2117 03/03/19 0156 03/03/19 0546 03/03/19 0958 03/04/19 0518 03/05/19 0410  NA  --  134* 135 136 134* 134*  K  --  3.1* 2.8* 3.1* 3.4* 3.3*  CL  --  90* 93* 93* 99 102  CO2  --  30 29 30 28  20*  GLUCOSE  --  102* 78 71 77 88  BUN  --  11 11 12 12 8   CREATININE  --  0.96 0.95 0.93 0.77 0.71  CALCIUM  --  8.1* 8.1* 8.0* 7.3* 7.6*  MG 0.7*  --  1.3*  --  1.2* 1.4*  PHOS 3.4  --   --   --   --   --    GFR: Estimated Creatinine Clearance: 110.4 mL/min (by C-G formula based on SCr of 0.71 mg/dL). Liver Function Tests: Recent Labs  Lab 03/02/19 1440 03/03/19 0546 03/04/19 0518 03/05/19 0410  AST 148* 76* 73* 69*  ALT 75* 50* 41 38  ALKPHOS 108 73 70 87  BILITOT 3.3* 3.0* 2.5* 2.3*  PROT 9.0* 6.4* 5.6* 5.7*  ALBUMIN 5.0 3.6 3.0* 2.9*   Recent Labs  Lab 03/02/19 1440 03/03/19 0545 03/04/19 0518  LIPASE 1,441* 115* 42   No results for input(s): AMMONIA in the last 168 hours. Coagulation Profile: Recent  Labs  Lab 03/02/19 1613  INR 1.3*   Cardiac Enzymes: No results for input(s): CKTOTAL, CKMB, CKMBINDEX, TROPONINI in the last 168 hours. BNP (last 3 results) No results for input(s): PROBNP in the last 8760 hours. HbA1C: Recent Labs    03/02/19 1440  HGBA1C 4.0*   CBG: Recent Labs  Lab 03/04/19 0522 03/04/19 1122 03/04/19 1834 03/04/19 2354 03/05/19 0608  GLUCAP 71 80 86 97 90   Lipid Profile: No results for input(s): CHOL, HDL, LDLCALC, TRIG, CHOLHDL, LDLDIRECT in the last 72 hours. Thyroid Function Tests: No results for input(s): TSH, T4TOTAL, FREET4, T3FREE, THYROIDAB in the last 72 hours. Anemia Panel: Recent Labs    03/03/19 0904 03/04/19 0518  VITAMINB12 559  --   FOLATE 5.5*  --   FERRITIN 876*  --   TIBC 216*  --   IRON 59  --   RETICCTPCT  --  1.6   Sepsis  Labs: Recent Labs  Lab 03/02/19 1440 03/02/19 1613 03/03/19 0546  LATICACIDVEN 7.7* 6.5* 1.4    Recent Results (from the past 240 hour(s))  Blood Culture (routine x 2)     Status: None (Preliminary result)   Collection Time: 03/02/19  2:47 PM   Specimen: Left Antecubital; Blood  Result Value Ref Range Status   Specimen Description LEFT ANTECUBITAL  Final   Special Requests   Final    BOTTLES DRAWN AEROBIC AND ANAEROBIC Blood Culture adequate volume   Culture   Final    NO GROWTH 2 DAYS Performed at [facility], [address] [place], [place] [address]    Report Status PENDING  Incomplete  Blood Culture (routine x 2)     Status: None (Preliminary result)   Collection Time: 03/02/19  2:49 PM   Specimen: Right Antecubital; Blood  Result Value Ref Range Status   Specimen Description RIGHT ANTECUBITAL  Final   Special Requests   Final    BOTTLES DRAWN AEROBIC AND ANAEROBIC Blood Culture adequate volume   Culture   Final    NO GROWTH 2 DAYS Performed at [facility], [address] [place], [place] [address]    Report Status PENDING  Incomplete  Urine culture     Status: Abnormal    Collection Time: 03/02/19  4:20 PM   Specimen: Urine, Catheterized  Result Value Ref Range Status   Specimen Description   Final    URINE, CATHETERIZED Performed at [facility], [address] [place], [place] [address]    Special Requests   Final    NONE Performed at [facility], [address] [place], [place] [address]    Culture (A)  Final    <10,000 COLONIES/mL INSIGNIFICANT GROWTH Performed at [facility], [address] [place], [place] [address]    Report Status 03/04/2019 FINAL  Final  SARS CORONAVIRUS 2 (TAT 6-24 HRS) Nasopharyngeal Nasopharyngeal Swab     Status: None   Collection Time: 03/02/19  5:49 PM   Specimen: Nasopharyngeal Swab  Result Value Ref Range Status   SARS Coronavirus 2 NEGATIVE NEGATIVE Final    Comment: (NOTE) SARS-CoV-2 target nucleic acids are NOT DETECTED. The SARS-CoV-2 RNA is generally detectable in upper and lower respiratory specimens during the acute phase of infection. Negative results do not preclude SARS-CoV-2 infection, do not rule out co-infections with other pathogens, and should not be used as the sole basis for treatment or other patient management decisions. Negative results must be combined with clinical observations, patient history, and epidemiological information. The expected result is Negative. Fact Sheet for Patients: HairSlick.no Fact Sheet for Healthcare Providers: quierodirigir.com This test is not yet approved or cleared by the [place] FDA and  has been authorized for detection and/or diagnosis of SARS-CoV-2 by FDA under an Emergency Use Authorization (EUA). This EUA will remain  in effect (meaning this test can be used) for the duration of the COVID-19 declaration under Section 56 4(b)(1) of the Act, 21 U.S.C. section 360bbb-3(b)(1), unless the authorization is terminated or revoked sooner. Performed at [facility], [address],  [place], [place] [address]   MRSA PCR Screening     Status: None   Collection Time: 03/03/19  1:12 PM   Specimen: Nasal Mucosa; Nasopharyngeal  Result Value Ref Range Status   MRSA by PCR NEGATIVE NEGATIVE Final    Comment:        The GeneXpert MRSA Assay (FDA approved for NASAL specimens  only), is one component of a comprehensive MRSA colonization surveillance program. It is not intended to diagnose MRSA infection nor to guide or monitor treatment for MRSA infections. Performed at [facility], [address] [place], [place] [address]          Radiology Studies: No results found.      Scheduled Meds: . amLODipine  5 mg Oral Daily  . Chlorhexidine Gluconate Cloth  6 each Topical Daily  . folic acid  1 mg Oral Daily  . insulin aspart  0-15 Units Subcutaneous Q6H  . multivitamin with minerals  1 tablet Oral Daily  . potassium chloride  40 mEq Oral TID  . thiamine  100 mg Oral Daily   Or  . thiamine  100 mg Intravenous Daily   Continuous Infusions: . sodium chloride 1,000 mL (03/05/19 0616)  . dexmedetomidine (PRECEDEX) IV infusion 0.5 mcg/kg/hr (03/05/19 0518)  . magnesium sulfate bolus IVPB       LOS: 3 days    Time spent: 30 minutes    Timothy Esther Hoover Brunette, DO [facility] Pager [phone]  If 7PM-7AM, please contact night-coverage [email or web address] Password [facility] 03/05/2019, 7:12 AM

## 2019-03-05 NOTE — Clinical Social Work Note (Signed)
Received consult for pt with substance abuse. Pt discussed in Progression today. Per MD, pt having DTs and is on Precedex drip. Pt not appropriate for TOC assessment at this time. Will follow and assess when pt is more stable.

## 2019-03-05 NOTE — Progress Notes (Signed)
    Subjective: Makes eye contact. Agitated and restless. Knows the year and city. Unsure why he is here. Wants to walk. Nursing states he had to be changed to Precedex in setting of ETOH abuse and DTs. Patient reports abdominal pain and nausea but does not appear in distress.   Objective: Vital signs in last 24 hours: Temp:  [97.8 F (36.6 C)-99.4 F (37.4 C)] 97.8 F (36.6 C) (09/28 0400) Pulse Rate:  [31-117] 64 (09/28 0700) Resp:  [16-27] 23 (09/28 0700) BP: (115-184)/(76-108) 156/103 (09/28 0700) SpO2:  [89 %-98 %] 93 % (09/28 0700) Weight:  [81.4 kg] 81.4 kg (09/28 0400) Last BM Date: 02/28/19 General:   Alert and oriented to person and place, unknown situation. Agitated and restless.  Abdomen:  Bowel sounds present, soft, TTP diffusely, no rebound or guarding, no distension Extremities:  Without  edema. Neurologic:  Alert and  oriented to person and place only    Intake/Output from previous day: 09/27 0701 - 09/28 0700 In: 4641.6 [P.O.:120; I.V.:4471.6; IV Piggyback:50] Out: 1610 [Urine:3850] Intake/Output this shift: No intake/output data recorded.  Lab Results: Recent Labs    03/03/19 0546 03/04/19 0518 03/05/19 0410  WBC 13.0* 6.1 4.9  HGB 14.4 11.0* 11.8*  HCT 39.8 33.1* 34.8*  PLT 77* 62* 83*   BMET Recent Labs    03/03/19 0958 03/04/19 0518 03/05/19 0410  NA 136 134* 134*  K 3.1* 3.4* 3.3*  CL 93* 99 102  CO2 30 28 20*  GLUCOSE 71 77 88  BUN 12 12 8   CREATININE 0.93 0.77 0.71  CALCIUM 8.0* 7.3* 7.6*   LFT Recent Labs    03/03/19 0546 03/04/19 0518 03/05/19 0410  PROT 6.4* 5.6* 5.7*  ALBUMIN 3.6 3.0* 2.9*  AST 76* 73* 69*  ALT 50* 41 38  ALKPHOS 73 70 87  BILITOT 3.0* 2.5* 2.3*   PT/INR Recent Labs    03/02/19 1613  LABPROT 15.9*  INR 1.3*    Assessment: 48 year old male with history of ETOH abuse, presenting with acute pancreatitis, alcoholic hepatitis and found to have gallstones. Etiology of pancreatitis most likely ETOH.  Due to DTs, Precedex drip drip has been started. Reporting abdominal pain and nausea but appears mainly agitated and not distressed. Continues on IV fluid resuscitation at 200 ml/hour. Renal function remains preserved. Leukocytosis has resolved. Would keep NPO until reporting improvement in abdominal pain/nausea. Hopefully may be able to transition to clears in next 24 hours.   Thrombocytopenia: due to ETOH use. No evidence for advanced liver disease, no splenomegaly.   Plan: Continue with IVFs Supportive measures Consider repeat imaging if no improvement in abdominal pain: difficult historian in setting of ETOH withdrawal Continue NPO except meds for now ETOH cessation    Annitta Needs, PhD, ANP-BC [facility]    LOS: 3 days    03/05/2019, 8:32 AM

## 2019-03-06 LAB — CBC
HCT: 37.3 % — ABNORMAL LOW (ref 39.0–52.0)
Hemoglobin: 13.1 g/dL (ref 13.0–17.0)
MCH: 35.8 pg — ABNORMAL HIGH (ref 26.0–34.0)
MCHC: 35.1 g/dL (ref 30.0–36.0)
MCV: 101.9 fL — ABNORMAL HIGH (ref 80.0–100.0)
Platelets: 132 10*3/uL — ABNORMAL LOW (ref 150–400)
RBC: 3.66 MIL/uL — ABNORMAL LOW (ref 4.22–5.81)
RDW: 11.9 % (ref 11.5–15.5)
WBC: 5.5 10*3/uL (ref 4.0–10.5)
nRBC: 0 % (ref 0.0–0.2)

## 2019-03-06 LAB — COMPREHENSIVE METABOLIC PANEL
ALT: 40 U/L (ref 0–44)
AST: 65 U/L — ABNORMAL HIGH (ref 15–41)
Albumin: 3 g/dL — ABNORMAL LOW (ref 3.5–5.0)
Alkaline Phosphatase: 105 U/L (ref 38–126)
Anion gap: 12 (ref 5–15)
BUN: 6 mg/dL (ref 6–20)
CO2: 20 mmol/L — ABNORMAL LOW (ref 22–32)
Calcium: 8 mg/dL — ABNORMAL LOW (ref 8.9–10.3)
Chloride: 104 mmol/L (ref 98–111)
Creatinine, Ser: 0.59 mg/dL — ABNORMAL LOW (ref 0.61–1.24)
GFR calc Af Amer: >60 mL/min (ref >60)
GFR calc non Af Amer: >60 mL/min (ref >60)
Glucose, Bld: 87 mg/dL (ref 70–99)
Potassium: 3.3 mmol/L — ABNORMAL LOW (ref 3.5–5.1)
Sodium: 136 mmol/L (ref 135–145)
Total Bilirubin: 1.8 mg/dL — ABNORMAL HIGH (ref 0.3–1.2)
Total Protein: 6.2 g/dL — ABNORMAL LOW (ref 6.5–8.1)

## 2019-03-06 LAB — GLUCOSE, CAPILLARY
Glucose-Capillary: 82 mg/dL (ref 70–99)
Glucose-Capillary: 91 mg/dL (ref 70–99)
Glucose-Capillary: 96 mg/dL (ref 70–99)

## 2019-03-06 LAB — LIPASE, BLOOD: Lipase: 30 U/L (ref 11–51)

## 2019-03-06 LAB — MAGNESIUM: Magnesium: 1.4 mg/dL — ABNORMAL LOW (ref 1.7–2.4)

## 2019-03-06 MED ORDER — SODIUM CHLORIDE 0.9 % IV SOLN
1000.0000 mL | INTRAVENOUS | Status: DC
  Administered 2019-03-06 – 2019-03-07 (×4): 1000 mL via INTRAVENOUS

## 2019-03-06 MED ORDER — PANTOPRAZOLE SODIUM 40 MG PO TBEC
40.0000 mg | DELAYED_RELEASE_TABLET | Freq: Every day | ORAL | Status: DC
  Administered 2019-03-07 – 2019-03-08 (×2): 40 mg via ORAL
  Filled 2019-03-06 (×2): qty 1

## 2019-03-06 MED ORDER — POTASSIUM CHLORIDE CRYS ER 20 MEQ PO TBCR
40.0000 meq | EXTENDED_RELEASE_TABLET | Freq: Three times a day (TID) | ORAL | Status: AC
  Administered 2019-03-06 (×3): 40 meq via ORAL
  Filled 2019-03-06 (×3): qty 2

## 2019-03-06 MED ORDER — ALUM & MAG HYDROXIDE-SIMETH 200-200-20 MG/5ML PO SUSP
30.0000 mL | ORAL | Status: DC | PRN
  Administered 2019-03-06: 30 mL via ORAL
  Filled 2019-03-06: qty 30

## 2019-03-06 MED ORDER — MAGNESIUM SULFATE 2 GM/50ML IV SOLN
2.0000 g | Freq: Once | INTRAVENOUS | Status: AC
  Administered 2019-03-06: 08:00:00 2 g via INTRAVENOUS
  Filled 2019-03-06: qty 50

## 2019-03-06 MED ORDER — FOLIC ACID 1 MG PO TABS: 1.0000 mg | ORAL_TABLET | Freq: Every day | ORAL | Status: DC

## 2019-03-06 MED ORDER — PANTOPRAZOLE SODIUM 40 MG IV SOLR
40.0000 mg | INTRAVENOUS | Status: DC
  Administered 2019-03-06: 12:00:00 40 mg via INTRAVENOUS
  Filled 2019-03-06: qty 40

## 2019-03-06 NOTE — Progress Notes (Signed)
    Subjective: Sleeping but arouses to verbal stimuli. Knows the year. Unknown city. Knows he is in the hospital. More conversant today although drowsy. States pain is "much, much less" than admission. No N/V. Tolerated oral meds yesterday per nursing staff.   Objective: Vital signs in last 24 hours: Temp:  [97.4 F (36.3 C)-98.8 F (37.1 C)] 97.4 F (36.3 C) (09/29 0745) Pulse Rate:  [56-121] 60 (09/29 0700) Resp:  [12-29] 21 (09/29 0700) BP: (123-190)/(72-129) 161/91 (09/29 0700) SpO2:  [90 %-98 %] 95 % (09/29 0700) Weight:  [79.1 kg] 79.1 kg (09/29 0500) Last BM Date: 02/28/19 General:  Awakens to verbal stimuli, hand mitts in place, more conversant, no distress  Head:  Normocephalic and atraumatic. Abdomen:  Bowel sounds present but hypoactive, soft, mildly TTP upper abdomen, LUQ, no rebound or guarding Extremities:  Without  edema. Neurologic:  Oriented to person and place only  Intake/Output from previous day: 09/28 0701 - 09/29 0700 In: 3890.7 [P.O.:120; I.V.:3720.7; IV Piggyback:50] Out: 6000 [Urine:6000] Intake/Output this shift: No intake/output data recorded.  Lab Results: Recent Labs    03/04/19 0518 03/05/19 0410 03/06/19 0420  WBC 6.1 4.9 5.5  HGB 11.0* 11.8* 13.1  HCT 33.1* 34.8* 37.3*  PLT 62* 83* 132*   BMET Recent Labs    03/04/19 0518 03/05/19 0410 03/06/19 0420  NA 134* 134* 136  K 3.4* 3.3* 3.3*  CL 99 102 104  CO2 28 20* 20*  GLUCOSE 77 88 87  BUN 12 8 6   CREATININE 0.77 0.71 0.59*  CALCIUM 7.3* 7.6* 8.0*   LFT Recent Labs    03/04/19 0518 03/05/19 0410 03/06/19 0420  PROT 5.6* 5.7* 6.2*  ALBUMIN 3.0* 2.9* 3.0*  AST 73* 69* 65*  ALT 41 38 40  ALKPHOS 70 87 105  BILITOT 2.5* 2.3* 1.8*    Assessment: 48 year old male with history of ETOH abuse, presenting with acute pancreatitis, degree of alcoholic hepatitis and found to have gallstones. Etiology of pancreatitis most likely ETOH. On Precedex for better management of DTs.   More conversant today and notes improvement in abdominal pain; he is eager to try clear liquids.   Thrombocytopenia: due to ETOH use. No evidence for advanced liver disease, no splenomegaly.  Plan: Clear liquids Decrease IVFs to 175 ml/hour Continue with supportive measures ETOH cessation   Annitta Needs, PhD, ANP-BC [facility]enter[facility]    LOS: 4 days    03/06/2019, 8:29 AM

## 2019-03-06 NOTE — Progress Notes (Signed)
PROGRESS NOTE    Timothy Houston  UJW:[id number] DOB: May 16, 1971 DOA: 03/02/2019 PCP: Patient, No Pcp Per   Brief Narrative:  Per HPI: Timothy Houston a 48 y.o.malewith a history of alcohol abuse. Patient seen for nausea, vomiting, abdominal pain x2 days. He has been unable to tolerate any food or oral liquids. His pain is worsening. Pain was worse with attempts to consume oral food or liquids. No palliating factors. Patient is an alcoholic and drinks a case of beer every couple of days. He gets the shakes when he does not have alcohol for a couple of days. His last drink was a couple days ago.  9/26:Patient was admitted with pancreatitis and started on aggressive IV fluid hydration as well as Dilaudid for pain control. He does not appear to have DKA, but did have severe hypokalemia as well as hypomagnesemia which is improving. We will asked GI to help see patient. Additionally will likely require inpatient psychiatric evaluation prior to discharge once stable.  9/27: Patient continues to have severe abdominal pain and has persistent hypokalemia and hypomagnesemia which will be repleted. Laboratory data trending in the right direction. No acute overnight events noted. He continues to have elevated blood pressure readings. He is stable for transfer to telemetry.  9/28: Patient continues to have significant abdominal pain along with hypertension.  He has been started on Precedex drip overnight and appears to have rested comfortably.  9/29: Patient continues to have ongoing DTs that are better controlled with use of Ativan pushes in addition to Precedex drip.  He appears to state that his abdominal pain is somewhat better this morning.  He continues to have hypomagnesemia and hypokalemia which are being repleted.  GI following.  Assessment & Plan:   Principal Problem:   Pancreatitis Active Problems:   Alcohol abuse   Type 2 diabetes mellitus without complication (HCC)  Lactic acidosis   Acute hypokalemia   Acute pancreatitis secondary to alcohol use -Continue aggressive IV fluid hydration -N.p.o. -Dilaudid for pain control -GI ongoing evaluation appreciated  Alcohol abuse with acute DT -Maintain on Precedex drip started 9/27 and continue careful monitoring in stepdown unit  Hypertensive crisis related to DT-improving -Maintain on Precedex drip -Hydralazine as needed for severe elevations -Started amlodipine 5 mg daily on 9/28 with improvement noted  Type 2 diabetes-controlled -Hemoglobin A1c 4.0% -We will hold Lantus for now maintain on SSI and monitor blood glucose -Continue n.p.o. status   Lactic acidosis -Currently improved  Acute hypokalemia/hypomagnesemia -Continue repletion and recheck in a.m. -Replete further today with oral potassium and IV magnesium  Anemia/thrombocytopenia-stable -We will evaluate further with anemia panel as patient has some macrocytosis -No overt bleeding identified -Repeat CBC in a.m. -Thrombocytopenia likely related to alcohol use;avoid Lovenox for now and switch to SCDs -Patient noted to have low folate levels and will plan to supplement   DVT prophylaxis:SCDs Code Status:Full Family Communication:None at bedside Disposition Plan:Continue treatment and management of pancreatitis and follow labs. Appreciate ongoing GI evaluation. Maintain on CIWA precautions with Precedex drip now due to DTs.  Continue stepdown unit monitoring due to active DTs and need for Precedex.   Consultants:  GI  Procedures:  None  Antimicrobials:   None  Subjective: Patient seen and evaluated today with ongoing DTs that appear to be better controlled this morning.  He appears to have less abdominal pain.  Objective: Vitals:   03/06/19 0600 03/06/19 0630 03/06/19 0636 03/06/19 0639  BP: (!) 178/97 (!) 174/102 (!) 184/96 (!) 184/96  Pulse: Marland Kitchen)  57 (!) 58 (!) 56   Resp: 18 20 20    Temp:       TempSrc:      SpO2: 96% 96% 95%   Weight:      Height:        Intake/Output Summary (Last 24 hours) at 03/06/2019 0719 Last data filed at 03/06/2019 0435 Gross per 24 hour  Intake 3890.71 ml  Output 6000 ml  Net -2109.29 ml   Filed Weights   03/03/19 1300 03/05/19 0400 03/06/19 0500  Weight: 88 kg 81.4 kg 79.1 kg    Examination:  General exam: Appears minimally agitated Respiratory system: Clear to auscultation. Respiratory effort normal. Cardiovascular system: S1 & S2 heard, RRR. No JVD, murmurs, rubs, gallops or clicks. No pedal edema. Gastrointestinal system: Abdomen is nondistended, soft and nontender. No organomegaly or masses felt. Normal bowel sounds heard. Central nervous system: Somnolent, but arousable Extremities: Symmetric 5 x 5 power. Skin: No rashes, lesions or ulcers Psychiatry: Cannot be assessed given current condition.    Data Reviewed: I have personally reviewed following labs and imaging studies  CBC: Recent Labs  Lab 03/02/19 1440 03/03/19 0546 03/04/19 0518 03/05/19 0410 03/06/19 0420  WBC 16.1* 13.0* 6.1 4.9 5.5  NEUTROABS 11.6*  --   --   --   --   HGB 17.4* 14.4 11.0* 11.8* 13.1  HCT 48.0 39.8 33.1* 34.8* 37.3*  MCV 95.4 99.3 105.8* 103.9* 101.9*  PLT 117* 77* 62* 83* 132*   Basic Metabolic Panel: Recent Labs  Lab 03/02/19 2117  03/03/19 0546 03/03/19 0958 03/04/19 0518 03/05/19 0410 03/06/19 0420  NA  --    < > 135 136 134* 134* 136  K  --    < > 2.8* 3.1* 3.4* 3.3* 3.3*  CL  --    < > 93* 93* 99 102 104  CO2  --    < > 29 30 28  20* 20*  GLUCOSE  --    < > 78 71 77 88 87  BUN  --    < > 11 12 12 8 6   CREATININE  --    < > 0.95 0.93 0.77 0.71 0.59*  CALCIUM  --    < > 8.1* 8.0* 7.3* 7.6* 8.0*  MG 0.7*  --  1.3*  --  1.2* 1.4* 1.4*  PHOS 3.4  --   --   --   --   --   --    < > = values in this interval not displayed.   GFR: Estimated Creatinine Clearance: 110.4 mL/min (A) (by C-G formula based on SCr of 0.59 mg/dL (L)).  Liver Function Tests: Recent Labs  Lab 03/02/19 1440 03/03/19 0546 03/04/19 0518 03/05/19 0410 03/06/19 0420  AST 148* 76* 73* 69* 65*  ALT 75* 50* 41 38 40  ALKPHOS 108 73 70 87 105  BILITOT 3.3* 3.0* 2.5* 2.3* 1.8*  PROT 9.0* 6.4* 5.6* 5.7* 6.2*  ALBUMIN 5.0 3.6 3.0* 2.9* 3.0*   Recent Labs  Lab 03/02/19 1440 03/03/19 0545 03/04/19 0518 03/06/19 0420  LIPASE 1,441* 115* 42 30   No results for input(s): AMMONIA in the last 168 hours. Coagulation Profile: Recent Labs  Lab 03/02/19 1613  INR 1.3*   Cardiac Enzymes: No results for input(s): CKTOTAL, CKMB, CKMBINDEX, TROPONINI in the last 168 hours. BNP (last 3 results) No results for input(s): PROBNP in the last 8760 hours. HbA1C: No results for input(s): HGBA1C in the last 72 hours. CBG: Recent Labs  Lab 03/05/19 0608 03/05/19 1150 03/05/19 1756 03/05/19 2316 03/06/19 0521  GLUCAP 90 85 87 90 82   Lipid Profile: No results for input(s): CHOL, HDL, LDLCALC, TRIG, CHOLHDL, LDLDIRECT in the last 72 hours. Thyroid Function Tests: No results for input(s): TSH, T4TOTAL, FREET4, T3FREE, THYROIDAB in the last 72 hours. Anemia Panel: Recent Labs    03/03/19 0904 03/04/19 0518  VITAMINB12 559  --   FOLATE 5.5*  --   FERRITIN 876*  --   TIBC 216*  --   IRON 59  --   RETICCTPCT  --  1.6   Sepsis Labs: Recent Labs  Lab 03/02/19 1440 03/02/19 1613 03/03/19 0546  LATICACIDVEN 7.7* 6.5* 1.4    Recent Results (from the past 240 hour(s))  Blood Culture (routine x 2)     Status: None (Preliminary result)   Collection Time: 03/02/19  2:47 PM   Specimen: Left Antecubital; Blood  Result Value Ref Range Status   Specimen Description LEFT ANTECUBITAL  Final   Special Requests   Final    BOTTLES DRAWN AEROBIC AND ANAEROBIC Blood Culture adequate volume   Culture   Final    NO GROWTH 3 DAYS Performed at [facility], [address], [place], [place] [address]    Report Status PENDING  Incomplete  Blood Culture  (routine x 2)     Status: None (Preliminary result)   Collection Time: 03/02/19  2:49 PM   Specimen: Right Antecubital; Blood  Result Value Ref Range Status   Specimen Description RIGHT ANTECUBITAL  Final   Special Requests   Final    BOTTLES DRAWN AEROBIC AND ANAEROBIC Blood Culture adequate volume   Culture   Final    NO GROWTH 3 DAYS Performed at [facility], [address], [place], [place] [address]    Report Status PENDING  Incomplete  Urine culture     Status: Abnormal   Collection Time: 03/02/19  4:20 PM   Specimen: Urine, Catheterized  Result Value Ref Range Status   Specimen Description   Final    URINE, CATHETERIZED Performed at [facility], [address], [place], [place] [address]    Special Requests   Final    NONE Performed at [facility], [address], [place], [place] [address]    Culture (A)  Final    <10,000 COLONIES/mL INSIGNIFICANT GROWTH Performed at [facility], [address] [place], [place] [address]    Report Status 03/04/2019 FINAL  Final  SARS CORONAVIRUS 2 (TAT 6-24 HRS) Nasopharyngeal Nasopharyngeal Swab     Status: None   Collection Time: 03/02/19  5:49 PM   Specimen: Nasopharyngeal Swab  Result Value Ref Range Status   SARS Coronavirus 2 NEGATIVE NEGATIVE Final    Comment: (NOTE) SARS-CoV-2 target nucleic acids are NOT DETECTED. The SARS-CoV-2 RNA is generally detectable in upper and lower respiratory specimens during the acute phase of infection. Negative results do not preclude SARS-CoV-2 infection, do not rule out co-infections with other pathogens, and should not be used as the sole basis for treatment or other patient management decisions. Negative results must be combined with clinical observations, patient history, and epidemiological information. The expected result is Negative. Fact Sheet for Patients: SugarRoll.be Fact Sheet for Healthcare Providers:  https://www.woods-mathews.com/ This test is not yet approved or cleared by the [place] FDA and  has been authorized for detection and/or diagnosis of SARS-CoV-2 by FDA under an Emergency Use Authorization (EUA). This EUA will remain  in effect (  meaning this test can be used) for the duration of the COVID-19 declaration under Section 56 4(b)(1) of the Act, 21 U.S.C. section 360bbb-3(b)(1), unless the authorization is terminated or revoked sooner. Performed at [facility], [address] [place], [place] [address]   MRSA PCR Screening     Status: None   Collection Time: 03/03/19  1:12 PM   Specimen: Nasal Mucosa; Nasopharyngeal  Result Value Ref Range Status   MRSA by PCR NEGATIVE NEGATIVE Final    Comment:        The GeneXpert MRSA Assay (FDA approved for NASAL specimens only), is one component of a comprehensive MRSA colonization surveillance program. It is not intended to diagnose MRSA infection nor to guide or monitor treatment for MRSA infections. Performed at [facility], [address] [place], [place] [address]          Radiology Studies: No results found.      Scheduled Meds: . amLODipine  5 mg Oral Daily  . Chlorhexidine Gluconate Cloth  6 each Topical Daily  . folic acid  1 mg Oral Daily  . insulin aspart  0-15 Units Subcutaneous Q6H  . multivitamin with minerals  1 tablet Oral Daily  . nicotine  7 mg Transdermal Daily  . potassium chloride  40 mEq Oral TID  . thiamine  100 mg Oral Daily   Or  . thiamine  100 mg Intravenous Daily   Continuous Infusions: . sodium chloride 1,000 mL (03/06/19 0435)  . dexmedetomidine (PRECEDEX) IV infusion 0.7 mcg/kg/hr (03/06/19 0519)  . magnesium sulfate bolus IVPB       LOS: 4 days    Time spent: 30 minutes    Samirah Scarpati Hoover BrunetteD Seylah Wernert, DO [facility] Pager [phone]  If 7PM-7AM, please contact night-coverage [email or web address] Password [email or web address] 03/06/2019, 7:19 AM

## 2019-03-07 ENCOUNTER — Telehealth: Payer: Self-pay | Admitting: Gastroenterology

## 2019-03-07 LAB — COMPREHENSIVE METABOLIC PANEL
ALT: 39 U/L (ref 0–44)
AST: 66 U/L — ABNORMAL HIGH (ref 15–41)
Albumin: 3.1 g/dL — ABNORMAL LOW (ref 3.5–5.0)
Alkaline Phosphatase: 121 U/L (ref 38–126)
Anion gap: 14 (ref 5–15)
BUN: 5 mg/dL — ABNORMAL LOW (ref 6–20)
CO2: 16 mmol/L — ABNORMAL LOW (ref 22–32)
Calcium: 8.1 mg/dL — ABNORMAL LOW (ref 8.9–10.3)
Chloride: 104 mmol/L (ref 98–111)
Creatinine, Ser: 0.51 mg/dL — ABNORMAL LOW (ref 0.61–1.24)
GFR calc Af Amer: >60 mL/min (ref >60)
GFR calc non Af Amer: >60 mL/min (ref >60)
Glucose, Bld: 105 mg/dL — ABNORMAL HIGH (ref 70–99)
Potassium: 3.9 mmol/L (ref 3.5–5.1)
Sodium: 134 mmol/L — ABNORMAL LOW (ref 135–145)
Total Bilirubin: 1.7 mg/dL — ABNORMAL HIGH (ref 0.3–1.2)
Total Protein: 6.4 g/dL — ABNORMAL LOW (ref 6.5–8.1)

## 2019-03-07 LAB — CULTURE, BLOOD (ROUTINE X 2)
Culture: NO GROWTH
Culture: NO GROWTH
Special Requests: ADEQUATE
Special Requests: ADEQUATE

## 2019-03-07 LAB — CBC
HCT: 40.1 % (ref 39.0–52.0)
Hemoglobin: 14.1 g/dL (ref 13.0–17.0)
MCH: 35.6 pg — ABNORMAL HIGH (ref 26.0–34.0)
MCHC: 35.2 g/dL (ref 30.0–36.0)
MCV: 101.3 fL — ABNORMAL HIGH (ref 80.0–100.0)
Platelets: 169 10*3/uL (ref 150–400)
RBC: 3.96 MIL/uL — ABNORMAL LOW (ref 4.22–5.81)
RDW: 12 % (ref 11.5–15.5)
WBC: 5.5 10*3/uL (ref 4.0–10.5)
nRBC: 0 % (ref 0.0–0.2)

## 2019-03-07 LAB — MAGNESIUM: Magnesium: 1.5 mg/dL — ABNORMAL LOW (ref 1.7–2.4)

## 2019-03-07 LAB — LIPASE, BLOOD: Lipase: 37 U/L (ref 11–51)

## 2019-03-07 LAB — GLUCOSE, CAPILLARY
Glucose-Capillary: 114 mg/dL — ABNORMAL HIGH (ref 70–99)
Glucose-Capillary: 130 mg/dL — ABNORMAL HIGH (ref 70–99)
Glucose-Capillary: 117 mg/dL — ABNORMAL HIGH (ref 70–99)
Glucose-Capillary: 110 mg/dL — ABNORMAL HIGH (ref 70–99)

## 2019-03-07 MED ORDER — HYDROMORPHONE HCL 1 MG/ML IJ SOLN
1.0000 mg | INTRAMUSCULAR | Status: DC | PRN
  Administered 2019-03-07 – 2019-03-08 (×4): 1 mg via INTRAVENOUS
  Filled 2019-03-07 (×4): qty 1

## 2019-03-07 MED ORDER — OXYCODONE HCL 5 MG PO TABS
5.0000 mg | ORAL_TABLET | ORAL | Status: DC | PRN
  Administered 2019-03-07 – 2019-03-08 (×2): 5 mg via ORAL
  Filled 2019-03-07 (×2): qty 1

## 2019-03-07 MED ORDER — DEXMEDETOMIDINE HCL IN NACL 400 MCG/100ML IV SOLN: 0.2000 ug/kg/h | INTRAVENOUS | Status: DC

## 2019-03-07 MED ORDER — SODIUM CHLORIDE 0.9 % IV SOLN
1000.0000 mL | INTRAVENOUS | Status: DC
  Administered 2019-03-07 – 2019-03-08 (×4): 1000 mL via INTRAVENOUS

## 2019-03-07 MED ORDER — CHLORDIAZEPOXIDE HCL 5 MG PO CAPS
10.0000 mg | ORAL_CAPSULE | Freq: Three times a day (TID) | ORAL | Status: DC
  Administered 2019-03-07 – 2019-03-08 (×3): 10 mg via ORAL
  Filled 2019-03-07 (×3): qty 2

## 2019-03-07 MED ORDER — MAGNESIUM SULFATE 2 GM/50ML IV SOLN
2.0000 g | Freq: Once | INTRAVENOUS | Status: AC
  Administered 2019-03-07: 08:00:00 2 g via INTRAVENOUS
  Filled 2019-03-07: qty 50

## 2019-03-07 MED ORDER — INSULIN ASPART 100 UNIT/ML ~~LOC~~ SOLN: 0.0000 [IU] | Freq: Three times a day (TID) | SUBCUTANEOUS | Status: DC

## 2019-03-07 NOTE — Progress Notes (Signed)
    Subjective: Sitting up in bed. Alert to person, year, place, unable to answer why he is here. Wants to go home. No longer in restraints. Tolerating soft diet. Abdominal pain improved. Mild nausea. +flatus.   Objective: Vital signs in last 24 hours: Temp:  [97.4 F (36.3 C)-99 F (37.2 C)] 97.4 F (36.3 C) (09/30 0804) Pulse Rate:  [30-95] 61 (09/30 0804) Resp:  [8-28] 15 (09/30 0804) BP: (111-191)/(52-147) 175/90 (09/30 0808) SpO2:  [89 %-99 %] 99 % (09/30 0804) Weight:  [76.6 kg] 76.6 kg (09/30 0500) Last BM Date: 02/28/19 General:   Drowsy but easily conversant. Oriented to person, place, time.  Abdomen:  Bowel sounds present, full but soft, non-tender, non-distended. . Extremities:  Without edema. Psych:  Flat affect   Intake/Output from previous day: 09/29 0701 - 09/30 0700 In: 3997.7 [I.V.:3997.7] Out: 4750 [Urine:4750] Intake/Output this shift: No intake/output data recorded.  Lab Results: Recent Labs    03/05/19 0410 03/06/19 0420 03/07/19 0414  WBC 4.9 5.5 5.5  HGB 11.8* 13.1 14.1  HCT 34.8* 37.3* 40.1  PLT 83* 132* 169   BMET Recent Labs    03/05/19 0410 03/06/19 0420 03/07/19 0414  NA 134* 136 134*  K 3.3* 3.3* 3.9  CL 102 104 104  CO2 20* 20* 16*  GLUCOSE 88 87 105*  BUN 8 6 5*  CREATININE 0.71 0.59* 0.51*  CALCIUM 7.6* 8.0* 8.1*   LFT Recent Labs    03/05/19 0410 03/06/19 0420 03/07/19 0414  PROT 5.7* 6.2* 6.4*  ALBUMIN 2.9* 3.0* 3.1*  AST 69* 65* 66*  ALT 38 40 39  ALKPHOS 87 105 121  BILITOT 2.3* 1.8* 1.7*    Assessment: 47 year old male with history of ETOH abuse, presenting with acute pancreatitis, degree of alcoholic hepatitis and found to have gallstones. Etiology of pancreatitis most likely ETOH. On Precedex for better management of DTs, with possible weaning today per nursing staff.  Advanced to soft diet yesterday, and tolerating this well. Clinically improved from a GI standpoint.   Thrombocytopenia:due to ETOH use.  No evidence for advanced liver disease, no splenomegaly.  Plan: Continue Dysphagia 3 diet Continue with supportive measures PPI daily Can decrease IVFs  Will follow peripherally as improved from a GI standpoint and arrange outpatient follow-up ETOH cessation  Annitta Needs, PhD, ANP-BC [facility]astroenter[facility]     LOS: 5 days    03/07/2019, 8:13 AM

## 2019-03-07 NOTE — Progress Notes (Signed)
PROGRESS NOTE    Timothy Houston  WUJ:[id number] DOB: 10/02/1970 DOA: 03/02/2019 PCP: Patient, No Pcp Per   Brief Narrative:  Per HPI: Timothy PhiGregory Smithis a 48 y.o.malewith a history of alcohol abuse. Patient seen for nausea, vomiting, abdominal pain x2 days. He has been unable to tolerate any food or oral liquids. His pain is worsening. Pain was worse with attempts to consume oral food or liquids. No palliating factors. Patient is an alcoholic and drinks a case of beer every couple of days. He gets the shakes when he does not have alcohol for a couple of days. His last drink was a couple days ago.  9/26:Patient was admitted with pancreatitis and started on aggressive IV fluid hydration as well as Dilaudid for pain control. He does not appear to have DKA, but did have severe hypokalemia as well as hypomagnesemia which is improving. Will asked GI to help see patient. Additionally will likely require inpatient psychiatric evaluation prior to discharge once stable.  9/27: Patient continues to have severe abdominal pain and has persistent hypokalemia and hypomagnesemia which will be repleted. Laboratory data trending in the right direction. No acute overnight events noted. He continues to have elevated blood pressure readings. He is stable for transfer to telemetry.  9/28: Patient continues to have significant abdominal pain along with hypertension.  He has been started on Precedex drip overnight and appears to have rested comfortably.  9/29: Patient continues to have ongoing DTs that are better controlled with use of Ativan pushes in addition to Precedex drip.  He appears to state that his abdominal pain is somewhat better this morning.  He continues to have hypomagnesemia and hypokalemia which are being repleted.  GI following.  9/30: Discontinue Precedex; continue CIWA protocol as needed with Ativan and a Star 3 times daily Librium.  Patient will be transfer to telemetry bed.  If  remains stable will discharge home on 03/08/2019.  Assessment & Plan:   Principal Problem:   Pancreatitis Active Problems:   Alcohol abuse   Type 2 diabetes mellitus without complication (HCC)   Lactic acidosis   Acute hypokalemia   Acute pancreatitis secondary to alcohol use -Continue IV fluid resuscitation but adjust rate now that he is tolerating by mouth. -Dilaudid for severe pain management; will start patient on oral oxycodone for moderate pain and first option. -Lipase level now within normal limits and diet balance as GI recommended -Continue PPI -Alcohol cessation counseling has been provided.  Alcohol abuse with acute DT -improved with use of precedex -will discontinue Precedex and the patient will be started on Librium along with PRN lorazepam by CIWA protocol. -Cessation counseling has been provided.  Hypertensive crisis related to DT -improving/stabilizing -continue Hydralazine as needed for severe elevations -Continue amlodipine -Heart healthy diet recommended.  Type 2 diabetes-controlled -Hemoglobin A1c 4.0% -Continue sliding scale insulin while inpatient -Modified carbohydrate diet at discharge will be recommended; no oral hypoglycemic medications to be initiated at discharge.  Lactic acidosis -Currently improved -Advised to maintain adequate hydration orally now that he is taking by mouth intake.  Acute hypokalemia/hypomagnesemia -Continue magnesium repletion -Continue to follow electrolytes trend and further replete as needed.  Anemia/thrombocytopenia -stable -patient has some macrocytosis, most likely associated with alcohol consumption. -No overt bleeding identified -Repeat CBC in a.m. to follow blood cell count. -Continue avoiding heparin products given low platelets count. -Continue folic acid supplementation.   DVT prophylaxis:SCDs Code Status:Full Family Communication:None at bedside Disposition Plan:Continue treatment and  management of pancreatitis and follow labs. Appreciate  ongoing GI evaluation. Maintain on CIWA precautions with Ativan as needed and a scheduled Librium.  Will discontinue Precedex and transfer to telemetry bed.  Continue advancing diet and using oral medications for pain management.  Hopefully discharge home in a.m.  Consultants:  GI  Procedures:  None  Antimicrobials:   None  Subjective: No fever, no chest pain, no nausea, no vomiting.  Oriented x3.  Reports just mild abdominal discomfort and is tolerating diet.  No significant active alcohol withdrawal symptoms currently.  Objective: Vitals:   03/07/19 1230 03/07/19 1300 03/07/19 1330 03/07/19 1400  BP: 104/76 118/77 127/82 130/87  Pulse: 71 70 69 65  Resp: Temp:      TempSrc:      SpO2: 100% 99% 98% 98%  Weight:      Height:        Intake/Output Summary (Last 24 hours) at 03/07/2019 1633 Last data filed at 03/07/2019 1240 Gross per 24 hour  Intake 3713.57 ml  Output 3650 ml  Net 63.57 ml   Filed Weights   03/05/19 0400 03/06/19 0500 03/07/19 0500  Weight: 81.4 kg 79.1 kg 76.6 kg    Examination: General exam: Alert, awake, oriented x 3; no chest pain, no nausea, no vomiting.  Reports good tolerance of dysphagia 3 diet so far.  No major distress. Respiratory system: Clear to auscultation. Respiratory effort normal. Cardiovascular system:RRR. No murmurs, rubs, gallops. Gastrointestinal system: Abdomen is nondistended, soft and only mildly tender in mid epigastric region. No organomegaly or masses felt. Normal bowel sounds heard. Central nervous system: Alert and oriented. No focal neurological deficits. Extremities: No C/C/E, +pedal pulses Skin: No rashes, lesions or ulcers Psychiatry: Judgement and insight appear normal. Mood & affect appropriate.   Data Reviewed: I have personally reviewed following labs and imaging studies  CBC: Recent Labs  Lab 03/02/19 1440 03/03/19 0546 03/04/19 0518  03/05/19 0410 03/06/19 0420 03/07/19 0414  WBC 16.1* 13.0* 6.1 4.9 5.5 5.5  NEUTROABS 11.6*  --   --   --   --   --   HGB 17.4* 14.4 11.0* 11.8* 13.1 14.1  HCT 48.0 39.8 33.1* 34.8* 37.3* 40.1  MCV 95.4 99.3 105.8* 103.9* 101.9* 101.3*  PLT 117* 77* 62* 83* 132* 169   Basic Metabolic Panel: Recent Labs  Lab 03/02/19 2117  03/03/19 0546 03/03/19 0958 03/04/19 0518 03/05/19 0410 03/06/19 0420 03/07/19 0414  NA  --    < > 135 136 134* 134* 136 134*  K  --    < > 2.8* 3.1* 3.4* 3.3* 3.3* 3.9  CL  --    < > 93* 93* 99 102 104 104  CO2  --    < > 20* 20* 16*  GLUCOSE  --    < > 78 71 77 88 87 105*  BUN  --    < > 5*  CREATININE  --    < > 0.95 0.93 0.77 0.71 0.59* 0.51*  CALCIUM  --    < > 8.1* 8.0* 7.3* 7.6* 8.0* 8.1*  MG 0.7*  --  1.3*  --  1.2* 1.4* 1.4* 1.5*  PHOS 3.4  --   --   --   --   --   --   --    < > = values in this interval not displayed.   GFR: Estimated Creatinine Clearance: 110.4 mL/min (A) (by C-G formula based on SCr of 0.51  mg/dL (L)).   Liver Function Tests: Recent Labs  Lab 03/03/19 0546 03/04/19 0518 03/05/19 0410 03/06/19 0420 03/07/19 0414  AST 76* 73* 69* 65* 66*  ALT 50* 41 38 40 39  ALKPHOS 73 70 87 105 121  BILITOT 3.0* 2.5* 2.3* 1.8* 1.7*  PROT 6.4* 5.6* 5.7* 6.2* 6.4*  ALBUMIN 3.6 3.0* 2.9* 3.0* 3.1*   Recent Labs  Lab 03/02/19 1440 03/03/19 0545 03/04/19 0518 03/06/19 0420 03/07/19 0414  LIPASE 1,441* 115* 42 30 37   Coagulation Profile: Recent Labs  Lab 03/02/19 1613  INR 1.3*   CBG: Recent Labs  Lab 03/06/19 0521 03/06/19 1133 03/06/19 1829 03/07/19 0122 03/07/19 1110  GLUCAP 82 91 96 114* 130*   Sepsis Labs: Recent Labs  Lab 03/02/19 1440 03/02/19 1613 03/03/19 0546  LATICACIDVEN 7.7* 6.5* 1.4    Recent Results (from the past 240 hour(s))  Blood Culture (routine x 2)     Status: None   Collection Time: 03/02/19  2:47 PM   Specimen: Left Antecubital; Blood  Result Value Ref Range  Status   Specimen Description LEFT ANTECUBITAL  Final   Special Requests   Final    BOTTLES DRAWN AEROBIC AND ANAEROBIC Blood Culture adequate volume   Culture   Final    NO GROWTH 5 DAYS Performed at [facility], [address] [place], [place] [address]    Report Status 03/07/2019 FINAL  Final  Blood Culture (routine x 2)     Status: None   Collection Time: 03/02/19  2:49 PM   Specimen: Right Antecubital; Blood  Result Value Ref Range Status   Specimen Description RIGHT ANTECUBITAL  Final   Special Requests   Final    BOTTLES DRAWN AEROBIC AND ANAEROBIC Blood Culture adequate volume   Culture   Final    NO GROWTH 5 DAYS Performed at [facility], [address], [place], [place] [address]    Report Status 03/07/2019 FINAL  Final  Urine culture     Status: Abnormal   Collection Time: 03/02/19  4:20 PM   Specimen: Urine, Catheterized  Result Value Ref Range Status   Specimen Description   Final    URINE, CATHETERIZED Performed at [facility], [address] [place], [place] [address]    Special Requests   Final    NONE Performed at [facility], [address], [place], [place] [address]    Culture (A)  Final    <10,000 COLONIES/mL INSIGNIFICANT GROWTH Performed at [facility], [place] [address] [place],  [address]    Report Status 03/04/2019 FINAL  Final  SARS CORONAVIRUS 2 (TAT 6-24 HRS) Nasopharyngeal Nasopharyngeal Swab     Status: None   Collection Time: 03/02/19  5:49 PM   Specimen: Nasopharyngeal Swab  Result Value Ref Range Status   SARS Coronavirus 2 NEGATIVE NEGATIVE Final    Comment: (NOTE) SARS-CoV-2 target nucleic acids are NOT DETECTED. The SARS-CoV-2 RNA is generally detectable in upper and lower respiratory specimens during the acute phase of infection. Negative results do not preclude SARS-CoV-2 infection, do not rule out co-infections with other pathogens, and should not be used as the sole basis for treatment or other patient management  decisions. Negative results must be combined with clinical observations, patient history, and epidemiological information. The expected result is Negative. Fact Sheet for Patients: SugarRoll.be Fact Sheet for Healthcare Providers: https://www.woods-mathews.com/ This test is not yet approved or cleared by the [place] FDA and  has been authorized for  detection and/or diagnosis of SARS-CoV-2 by FDA under an Emergency Use Authorization (EUA). This EUA will remain  in effect (meaning this test can be used) for the duration of the COVID-19 declaration under Section 56 4(b)(1) of the Act, 21 U.S.C. section 360bbb-3(b)(1), unless the authorization is terminated or revoked sooner. Performed at [facility], [address] [place], [place] [address]   MRSA PCR Screening     Status: None   Collection Time: 03/03/19  1:12 PM   Specimen: Nasal Mucosa; Nasopharyngeal  Result Value Ref Range Status   MRSA by PCR NEGATIVE NEGATIVE Final    Comment:        The GeneXpert MRSA Assay (FDA approved for NASAL specimens only), is one component of a comprehensive MRSA colonization surveillance program. It is not intended to diagnose MRSA infection nor to guide or monitor treatment for MRSA infections. Performed at [facility], [address] [place], [place] [address]      Scheduled Meds: . amLODipine  5 mg Oral Daily  . chlordiazePOXIDE  10 mg Oral TID  . Chlorhexidine Gluconate Cloth  6 each Topical Daily  . folic acid  1 mg Oral Daily  . insulin aspart  0-15 Units Subcutaneous Q6H  . multivitamin with minerals  1 tablet Oral Daily  . nicotine  7 mg Transdermal Daily  . pantoprazole  40 mg Oral QAC breakfast  . thiamine  100 mg Oral Daily   Or  . thiamine  100 mg Intravenous Daily   Continuous Infusions: . sodium chloride 150 mL/hr at 03/07/19 1240     LOS: 5 days    Time spent: 30 minutes    Vassie Loll, MD [facility] Pager [phone]  03/07/2019, 4:32 PM

## 2019-03-07 NOTE — Telephone Encounter (Signed)
Please arrange outpatient hospital follow-up in 6 weeks. History of ETOH induced pancreatitis.

## 2019-03-08 ENCOUNTER — Encounter: Payer: Self-pay | Admitting: Gastroenterology

## 2019-03-08 LAB — GLUCOSE, CAPILLARY: Glucose-Capillary: 87 mg/dL (ref 70–99)

## 2019-03-08 MED ORDER — FOLIC ACID 1 MG PO TABS: 1.0000 mg | ORAL_TABLET | Freq: Every day | ORAL | 3 refills | Status: DC

## 2019-03-08 MED ORDER — PANTOPRAZOLE SODIUM 40 MG PO TBEC: 40.0000 mg | DELAYED_RELEASE_TABLET | Freq: Every day | ORAL | 1 refills | Status: DC

## 2019-03-08 MED ORDER — ONDANSETRON 8 MG PO TBDP: 8.0000 mg | ORAL_TABLET | Freq: Three times a day (TID) | ORAL | 0 refills | Status: DC | PRN

## 2019-03-08 MED ORDER — CHLORDIAZEPOXIDE HCL 10 MG PO CAPS: ORAL_CAPSULE | ORAL | 0 refills | Status: DC

## 2019-03-08 MED ORDER — AMLODIPINE BESYLATE 5 MG PO TABS: 5.0000 mg | ORAL_TABLET | Freq: Every day | ORAL | 3 refills | Status: DC

## 2019-03-08 MED ORDER — HYDROCODONE-ACETAMINOPHEN 10-300 MG PO TABS: 1.0000 | ORAL_TABLET | Freq: Three times a day (TID) | ORAL | 0 refills | Status: AC | PRN

## 2019-03-08 MED ORDER — THIAMINE HCL 100 MG PO TABS: 100.0000 mg | ORAL_TABLET | Freq: Every day | ORAL | 3 refills | Status: DC

## 2019-03-08 MED ORDER — NICOTINE 14 MG/24HR TD PT24: 14.0000 mg | MEDICATED_PATCH | Freq: Every day | TRANSDERMAL | 1 refills | Status: DC

## 2019-03-08 NOTE — Clinical Social Work Note (Signed)
Patient indicated that the attending had provided him with some resources. He stated that he did not need additional resources related to treatment. LCSW offered inpatient and outpatient resources and patient declined.    Timothy Houston, [name], LCSW

## 2019-03-08 NOTE — Evaluation (Signed)
Physical Therapy Evaluation Patient Details Name: Timothy Houston MRN: [id number] DOB: 03/02/1971 Today's Date: 03/08/2019   History of Present Illness  Timothy Houston is a 48 y.o. male with a history of alcohol abuse.  Patient seen for nausea, vomiting, abdominal pain x2 days.  He has been unable to tolerate any food or oral liquids.  His pain is worsening.  Pain was worse with attempts to consume oral food or liquids.  No palliating factors.  Patient is an alcoholic and drinks a case of beer every couple of days.  He gets the shakes when he does not have alcohol for a couple of days.  His last drink was a couple days ago.    Clinical Impression  Patient functioning near baseline for functional mobility and gait, demonstrates slow labored movement for sitting up at bedside, c/o uncontrolled shaking of BLE after sitting up, very unsteady on feet with leaning on nearby objects for support when using SPC, safer when using RW, but limited for ambulation due to c/o fatigue and lightheadedness.  Patient's roommate present during treatment and states he can take care of patient, patient agrees.  Patient requested to go back to bed after therapy.  Plan:  Patient to be discharged home today and discharged from physical therapy to care of nursing for ambulation daily as tolerated for length of stay.     Follow Up Recommendations Home health PT;Supervision for mobility/OOB;Supervision/Assistance - 24 hour    Equipment Recommendations  Rolling walker with 5" wheels;Cane(Patient needs replacement single point cane)    Recommendations for Other Services       Precautions / Restrictions Precautions Precautions: Fall Restrictions Weight Bearing Restrictions: No      Mobility  Bed Mobility Overal bed mobility: Modified Independent             General bed mobility comments: increased time  Transfers Overall transfer level: Needs assistance Equipment used: Straight cane;Rolling walker (2  wheeled) Transfers: Sit to/from Stand;Stand Pivot Transfers Sit to Stand: Min guard;Min assist Stand pivot transfers: Min guard;Min assist       General transfer comment: unsteady using SPC, safer using RW  Ambulation/Gait Ambulation/Gait assistance: Min Web designer (Feet): 15 Feet Assistive device: Rolling walker (2 wheeled) Gait Pattern/deviations: Decreased step length - right;Decreased step length - left;Decreased stride length Gait velocity: slow   General Gait Details: slow labored cadence with near loss of balance using SPC, increased balance using RW for support, limited secondary to c/o fatigue  Stairs            Wheelchair Mobility    Modified Rankin (Stroke Patients Only)       Balance Overall balance assessment: Needs assistance Sitting-balance support: Feet supported;No upper extremity supported Sitting balance-Leahy Scale: Good Sitting balance - Comments: seated at bedside   Standing balance support: During functional activity;Single extremity supported Standing balance-Leahy Scale: Poor Standing balance comment: fair/poor using SPC, fair using RW                             Pertinent Vitals/Pain Pain Assessment: No/denies pain    Home Living Family/patient expects to be discharged to:: Private residence Living Arrangements: Non-relatives/Friends(roommate) Available Help at Discharge: Friend(s);Available 24 hours/day Type of Home: House Home Access: Stairs to enter Entrance Stairs-Rails: Right;Left;Can reach both Entrance Stairs-Number of Steps: 3 Home Layout: One level Home Equipment: Cane - single point Additional Comments: needs cane replaced due to old, "per patient"  Prior Function Level of Independence: Needs assistance   Gait / Transfers Assistance Needed: household ambulator using SPC and or leaning on nearby objects for support  ADL's / Homemaking Assistance Needed: assisted by roommate        Hand  Dominance        Extremity/Trunk Assessment   Upper Extremity Assessment Upper Extremity Assessment: Generalized weakness    Lower Extremity Assessment Lower Extremity Assessment: Generalized weakness    Cervical / Trunk Assessment Cervical / Trunk Assessment: Normal  Communication   Communication: No difficulties  Cognition Arousal/Alertness: Awake/alert Behavior During Therapy: WFL for tasks assessed/performed;Impulsive Overall Cognitive Status: Within Functional Limits for tasks assessed                                 General Comments: 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, "per patient"      General Comments      Exercises     Assessment/Plan    PT Assessment All further PT needs can be met in the next venue of care  PT Problem List Decreased strength;Decreased activity tolerance;Decreased balance;Decreased mobility       PT Treatment Interventions      PT Goals (Current goals can be found in the Care Plan section)  Acute Rehab PT Goals Patient Stated Goal: return home with roommate to assist PT Goal Formulation: With patient(and roommate) Time For Goal Achievement: 03/08/19 Potential to Achieve Goals: Good    Frequency     Barriers to discharge        Co-evaluation               AM-PAC PT "6 Clicks" Mobility  Outcome Measure Help needed turning from your back to your side while in a flat bed without using bedrails?: None Help needed moving from lying on your back to sitting on the side of a flat bed without using bedrails?: None Help needed moving to and from a bed to a chair (including a wheelchair)?: A Little Help needed standing up from a chair using your arms (e.g., wheelchair or bedside chair)?: A Little Help needed to walk in hospital room?: A Lot Help needed climbing 3-5 steps with a railing? : A Lot 6 Click Score: 18    End of Session Equipment Utilized During Treatment: Gait belt Activity Tolerance: Patient  tolerated treatment well;Patient limited by fatigue Patient left: in bed;with call bell/phone within reach;with family/visitor present Nurse Communication: Mobility status PT Visit Diagnosis: Unsteadiness on feet (R26.81);Other abnormalities of gait and mobility (R26.89);Muscle weakness (generalized) (M62.81)    Time: 1020-1053 PT Time Calculation (min) (ACUTE ONLY): 33 min   Charges:   PT Evaluation $PT Eval Moderate Complexity: 1 Mod PT Treatments $Therapeutic Activity: 23-37 mins        12:18 PM, 03/08/19 Lonell Grandchild, MPT Physical Therapist with [facility] 336 [phone] office [phone] mobile phone

## 2019-03-08 NOTE — Progress Notes (Signed)
NURSING PROGRESS NOTE  Timothy Houston [id number] Discharge Data: 03/08/2019 11:17 AM Attending Provider: Barton Dubois, MD MCN:[id number], No Pcp Per     Timothy Houston to be D/C'd Home per MD order.  Discussed with the patient the After Visit Summary and all questions fully answered. All IV's discontinued with no bleeding noted. All belongings returned to patient for patient to take home.   Last Vital Signs:  Blood pressure 134/87, pulse (!) 26, temperature 98.8 F (37.1 C), temperature source Oral, resp. rate 16, height 5\' 8"  (1.727 m), weight 78 kg, SpO2 (!) 85 %.  Discharge Medication List Allergies as of 03/08/2019   No Known Allergies     Medication List    STOP taking these medications   cyclobenzaprine 5 MG tablet Commonly known as: FLEXERIL   ibuprofen 800 MG tablet Commonly known as: ADVIL   sulfamethoxazole-trimethoprim 800-160 MG tablet Commonly known as: BACTRIM DS     TAKE these medications   amLODipine 5 MG tablet Commonly known as: NORVASC Take 1 tablet (5 mg total) by mouth daily. Start taking on: March 09, 2019   chlordiazePOXIDE 10 MG capsule Commonly known as: LIBRIUM Take 1 tablet by mouth 3 times a day for 3 days; then 1 tablet by mouth twice a day x 3 days; then 1 tablet by mouth daily x 3 days and stop Librium.   folic acid 1 MG tablet Commonly known as: FOLVITE Take 1 tablet (1 mg total) by mouth daily. Start taking on: March 09, 2019   gabapentin 800 MG tablet Commonly known as: NEURONTIN Take 800 mg by mouth 3 (three) times daily.   HYDROcodone-Acetaminophen 10-300 MG Tabs Commonly known as: Vicodin HP Take 1 tablet by mouth every 8 (eight) hours as needed for up to 5 days.   nicotine 14 mg/24hr patch Commonly known as: NICODERM CQ - dosed in mg/24 hours Place 1 patch (14 mg total) onto the skin daily. Start taking on: March 09, 2019   ondansetron 8 MG disintegrating tablet Commonly known as: Zofran ODT Take 1 tablet (8 mg total) by  mouth every 8 (eight) hours as needed for nausea or vomiting.   pantoprazole 40 MG tablet Commonly known as: PROTONIX Take 1 tablet (40 mg total) by mouth daily before breakfast. Start taking on: March 09, 2019   thiamine 100 MG tablet Take 1 tablet (100 mg total) by mouth daily. Start taking on: March 09, 2019        Doristine Devoid, RN

## 2019-03-08 NOTE — Telephone Encounter (Signed)
PATIENT SCHEDULED AND LETTER SENT  °

## 2019-03-08 NOTE — Discharge Summary (Signed)
Physician Discharge Summary  Timothy Houston SAY:[id number] DOB: September 12, 1970 DOA: 03/02/2019  PCP: Patient, No Pcp Per  Admit date: 03/02/2019 Discharge date: 03/08/2019  Time spent: 35 minutes  Recommendations for Outpatient Follow-up:  1. Repeat CBC to follow hemoglobin trend and platelets count 2. Repeat basic metabolic panel to follow electrolytes and renal function   Discharge Diagnoses:  Principal Problem:   Pancreatitis Active Problems:   Alcohol abuse   Type 2 diabetes mellitus without complication (HCC)   Lactic acidosis   Acute hypokalemia Hypertension Alcohol withdrawal  Discharge Condition: Stable and improved.  Discharged home with instruction to follow-up with PCP in 10 days and to follow-up with gastroenterology service as instructed.  Diet recommendation: Heart healthy/modified carbohydrate diet.  Filed Weights   03/06/19 0500 03/07/19 0500 03/08/19 0638  Weight: 79.1 kg 76.6 kg 78 kg    History of present illness:  As per H&P written by Dr. Johnnye Sima on 03/02/2019 48 y.o. male with a history of alcohol abuse.  Patient seen for nausea, vomiting, abdominal pain x2 days.  He has been unable to tolerate any food or oral liquids.  His pain is worsening.  Pain was worse with attempts to consume oral food or liquids.  No palliating factors.  Patient is an alcoholic and drinks a case of beer every couple of days.  He gets the shakes when he does not have alcohol for a couple of days.  His last drink was a couple days ago.  Emergency Department Course: Lipase elevated to 1200.  CT of the abdomen shows acute pancreatitis with duodenitis.  Potassium is 2.2 with a anion gap of greater than 20.  Bicarb is normal at 23.  LFTs minimally elevated.  Lactic acid 7.7 and 6.5 on repeat.  White count 16.1.  Patient did receive a gram of Rocephin with concerns of sepsis.  He was about to get Flagyl and this was discontinued due to the realization that he had pancreatitis with a lactic  acidosis.  Hospital Course:  Acute pancreatitis secondary to alcohol use -Advised to maintain adequate hydration. -Discharge with short amount prescription of Vicodin and Zofran to assist with symptomatic management. -Lipase level now within normal limits and diet further advanced as per GI recommendations.   -Continue PPI daily. -Alcohol cessation counseling has been provided.  Alcohol abusewith acute DT -improved with use of precedex -No signs of acute withdrawal appreciated currently. -Patient will be discharged on tapering Librium management. -Cessation counseling has been provided.  Hypertensive crisis related to DT -improved/stable at discharge -Continue amlodipine -Heart healthy diet recommended.  Type 2 diabetes-controlled -Hemoglobin A1c 4.0% -Patient advised to continue modified carbohydrate diet. -no oral hypoglycemic medications to be initiated at discharge.  Lactic acidosis -Improved/resolved -Advised to maintain adequate hydration orally now that he is taking and tolerating by mouth intake.  Acute hypokalemia/hypomagnesemia -Magnesium and potassium both repleted and within normal limits at discharge -Repeat basic metabolic panel and magnesium level to reassess electrolytes stability.  Anemia/thrombocytopenia -stable -patient has some macrocytosis and thrombocytopenia, most likely associated with alcohol consumption. -No overt bleeding identified -Repeat CBC at follow-up visit to reassess blood cell count and stability. -Continue folic acid supplementation. -Alcohol cessation counseling provided.  Procedures: See below for x-ray reports.  Consultations:  Gastroenterology service  Discharge Exam: Vitals:   03/08/19 0500 03/08/19 0741  BP:    Pulse:    Resp:  16  Temp: 98 F (36.7 C) 98.8 F (37.1 C)  SpO2:     General exam: Alert,  awake, oriented x 3; no chest pain, no nausea, no vomiting.  Reports good tolerance of dysphagia 3 diet so  far.  No major distress. Respiratory system: Clear to auscultation. Respiratory effort normal. Cardiovascular system:RRR. No murmurs, rubs, gallops. Gastrointestinal system: Abdomen is nondistended, soft and only mildly tender in mid epigastric region. No organomegaly or masses felt. Normal bowel sounds heard. Central nervous system: Alert and oriented. No focal neurological deficits. Extremities: No C/C/E, +pedal pulses Skin: No rashes, lesions or ulcers Psychiatry: Judgement and insight appear normal. Mood & affect appropriate.   Discharge Instructions   Discharge Instructions    Diet - low sodium heart healthy   Complete by: As directed    Discharge instructions   Complete by: As directed    Stop alcohol consumption Take medications as prescribed Continue adequate hydration Follow heart healthy/low-fat diet Follow-up with PCP in 10 days Follow-up with gastroenterology service as instructed, (office will contact you with appointment details).     Allergies as of 03/08/2019   No Known Allergies     Medication List    STOP taking these medications   cyclobenzaprine 5 MG tablet Commonly known as: FLEXERIL   ibuprofen 800 MG tablet Commonly known as: ADVIL   sulfamethoxazole-trimethoprim 800-160 MG tablet Commonly known as: BACTRIM DS     TAKE these medications   amLODipine 5 MG tablet Commonly known as: NORVASC Take 1 tablet (5 mg total) by mouth daily. Start taking on: March 09, 2019   chlordiazePOXIDE 10 MG capsule Commonly known as: LIBRIUM Take 1 tablet by mouth 3 times a day for 3 days; then 1 tablet by mouth twice a day x 3 days; then 1 tablet by mouth daily x 3 days and stop Librium.   folic acid 1 MG tablet Commonly known as: FOLVITE Take 1 tablet (1 mg total) by mouth daily. Start taking on: March 09, 2019   gabapentin 800 MG tablet Commonly known as: NEURONTIN Take 800 mg by mouth 3 (three) times daily.   HYDROcodone-Acetaminophen 10-300 MG  Tabs Commonly known as: Vicodin HP Take 1 tablet by mouth every 8 (eight) hours as needed for up to 5 days.   nicotine 14 mg/24hr patch Commonly known as: NICODERM CQ - dosed in mg/24 hours Place 1 patch (14 mg total) onto the skin daily. Start taking on: March 09, 2019   ondansetron 8 MG disintegrating tablet Commonly known as: Zofran ODT Take 1 tablet (8 mg total) by mouth every 8 (eight) hours as needed for nausea or vomiting.   pantoprazole 40 MG tablet Commonly known as: PROTONIX Take 1 tablet (40 mg total) by mouth daily before breakfast. Start taking on: March 09, 2019   thiamine 100 MG tablet Take 1 tablet (100 mg total) by mouth daily. Start taking on: March 09, 2019      No Known Allergies   The results of significant diagnostics from this hospitalization (including imaging, microbiology, ancillary and laboratory) are listed below for reference.    Significant Diagnostic Studies: Ct Abdomen Pelvis W Contrast  Result Date: 03/02/2019 CLINICAL DATA:  Pt c/o severe mid abdominal pain and vomiting x 2 days. Denies diarrhea. Pt is diaphoretic^154mL OMNIPAQUE IOHEXOL 300 MG/ML SOLN EXAM: CT ABDOMEN AND PELVIS WITH CONTRAST TECHNIQUE: Multidetector CT imaging of the abdomen and pelvis was performed using the standard protocol following bolus administration of intravenous contrast. CONTRAST:  OMNIPAQUE IOHEXOL 300 MG/ML  SOLN COMPARISON:  None. FINDINGS: Lower chest: Bandlike opacity at the right lung base likely  reflects atelectasis. No pleural or pericardial effusion. Hepatobiliary: Diffuse hepatic steatosis. There are multiple small layering gallstones. No evidence of gallbladder wall thickening. No biliary dilation. Pancreas: Heterogeneous appearance of the pancreatic head with peripancreatic fat stranding predominantly involving the pancreatic head but extending diffusely along the body and tail most likely representing acute pancreatitis. No focal peripancreatic fluid  collection. No pancreatic duct dilation. There is duodenal wall thickening in the first through fourth portions with associated fat stranding. Spleen: Normal in size without focal abnormality. Adrenals/Urinary Tract: Adrenal glands are unremarkable. Kidneys are normal, without renal calculi, focal lesion, or hydronephrosis. Bladder is unremarkable. Stomach/Bowel: Stomach is within normal limits. Appendix appears normal. Multiple mildly dilated loops of both large and small bowel favored to represent ileus. No bowel wall thickening. No free air. Vascular/Lymphatic: No significant vascular findings are present. No enlarged abdominal or pelvic lymph nodes. Reproductive: Prostate is unremarkable. Other: No abdominal wall hernia or abnormality. Musculoskeletal: Age-indeterminate mild height loss at the T11 vertebral body. IMPRESSION: 1. Heterogeneous appearance of the pancreatic head with peripancreatic fat stranding predominantly involving the pancreatic head but extending along the body and tail, most likely representing acute pancreatitis. No focal peripancreatic fluid collection. 2. Duodenal wall thickening with inflammatory changes likely a reactive duodenitis. 3. Hepatic steatosis. 4. Cholelithiasis without evidence of cholecystitis. 5. Multiple mildly dilated loops of both large and small bowel favored to represent ileus. 6. Age-indeterminate height loss at the T11 vertebral body. Electronically Signed   By: Emmaline KluverNancy  Ballantyne M.D.   On: 03/02/2019 16:09    Microbiology: Recent Results (from the past 240 hour(s))  Blood Culture (routine x 2)     Status: None   Collection Time: 03/02/19  2:47 PM   Specimen: Left Antecubital; Blood  Result Value Ref Range Status   Specimen Description LEFT ANTECUBITAL  Final   Special Requests   Final    BOTTLES DRAWN AEROBIC AND ANAEROBIC Blood Culture adequate volume   Culture   Final    NO GROWTH 5 DAYS Performed at [facility], [address] [place], [place]  [address]    Report Status 03/07/2019 FINAL  Final  Blood Culture (routine x 2)     Status: None   Collection Time: 03/02/19  2:49 PM   Specimen: Right Antecubital; Blood  Result Value Ref Range Status   Specimen Description RIGHT ANTECUBITAL  Final   Special Requests   Final    BOTTLES DRAWN AEROBIC AND ANAEROBIC Blood Culture adequate volume   Culture   Final    NO GROWTH 5 DAYS Performed at [facility], [address] [place], [place] [address]    Report Status 03/07/2019 FINAL  Final  Urine culture     Status: Abnormal   Collection Time: 03/02/19  4:20 PM   Specimen: Urine, Catheterized  Result Value Ref Range Status   Specimen Description   Final    URINE, CATHETERIZED Performed at [facility], [address] [place], [place] [address]    Special Requests   Final    NONE Performed at [facility], [address] [place], [place] [address]    Culture (A)  Final    <10,000 COLONIES/mL INSIGNIFICANT GROWTH Performed at [facility], [address] [place], [place] [address]    Report Status 03/04/2019 FINAL  Final  SARS CORONAVIRUS 2 (TAT 6-24 HRS) Nasopharyngeal Nasopharyngeal Swab     Status: None   Collection Time: 03/02/19  5:49 PM   Specimen: Nasopharyngeal Swab  Result  Value Ref Range Status   SARS Coronavirus 2 NEGATIVE NEGATIVE Final    Comment: (NOTE) SARS-CoV-2 target nucleic acids are NOT DETECTED. The SARS-CoV-2 RNA is generally detectable in upper and lower respiratory specimens during the acute phase of infection. Negative results do not preclude SARS-CoV-2 infection, do not rule out co-infections with other pathogens, and should not be used as the sole basis for treatment or other patient management decisions. Negative results must be combined with clinical observations, patient history, and epidemiological information. The expected result is Negative. Fact Sheet for Patients: HairSlick.no Fact Sheet for Healthcare  Providers: quierodirigir.com This test is not yet approved or cleared by the [place] FDA and  has been authorized for detection and/or diagnosis of SARS-CoV-2 by FDA under an Emergency Use Authorization (EUA). This EUA will remain  in effect (meaning this test can be used) for the duration of the COVID-19 declaration under Section 56 4(b)(1) of the Act, 21 U.S.C. section 360bbb-3(b)(1), unless the authorization is terminated or revoked sooner. Performed at [facility], [address] [place], [place] [address]   MRSA PCR Screening     Status: None   Collection Time: 03/03/19  1:12 PM   Specimen: Nasal Mucosa; Nasopharyngeal  Result Value Ref Range Status   MRSA by PCR NEGATIVE NEGATIVE Final    Comment:        The GeneXpert MRSA Assay (FDA approved for NASAL specimens only), is one component of a comprehensive MRSA colonization surveillance program. It is not intended to diagnose MRSA infection nor to guide or monitor treatment for MRSA infections. Performed at [facility], [address], [place], [place] [address]      Labs: Basic Metabolic Panel: Recent Labs  Lab 03/02/19 2117  03/03/19 0546 03/03/19 0981 03/04/19 0518 03/05/19 0410 03/06/19 0420 03/07/19 0414  NA  --    < > 135 136 134* 134* 136 134*  K  --    < > 2.8* 3.1* 3.4* 3.3* 3.3* 3.9  CL  --    < > 93* 93* 99 102 104 104  CO2  --    < > 20* 20* 16*  GLUCOSE  --    < > 78 71 77 88 87 105*  BUN  --    < > 5*  CREATININE  --    < > 0.95 0.93 0.77 0.71 0.59* 0.51*  CALCIUM  --    < > 8.1* 8.0* 7.3* 7.6* 8.0* 8.1*  MG 0.7*  --  1.3*  --  1.2* 1.4* 1.4* 1.5*  PHOS 3.4  --   --   --   --   --   --   --    < > = values in this interval not displayed.   Liver Function Tests: Recent Labs  Lab 03/03/19 0546 03/04/19 0518 03/05/19 0410 03/06/19 0420 03/07/19 0414  AST 76* 73* 69* 65* 66*  ALT 50* 41 38 40 39  ALKPHOS 73 70 87 105 121   BILITOT 3.0* 2.5* 2.3* 1.8* 1.7*  PROT 6.4* 5.6* 5.7* 6.2* 6.4*  ALBUMIN 3.6 3.0* 2.9* 3.0* 3.1*   Recent Labs  Lab 03/02/19 1440 03/03/19 0545 03/04/19 0518 03/06/19 0420 03/07/19 0414  LIPASE 1,441* 115* 42 30 37   CBC: Recent Labs  Lab 03/02/19 1440 03/03/19 0546 03/04/19 0518 03/05/19 0410 03/06/19 0420 03/07/19 0414  WBC 16.1* 13.0* 6.1 4.9 5.5 5.5  NEUTROABS 11.6*  --   --   --   --   --  HGB 17.4* 14.4 11.0* 11.8* 13.1 14.1  HCT 48.0 39.8 33.1* 34.8* 37.3* 40.1  MCV 95.4 99.3 105.8* 103.9* 101.9* 101.3*  PLT 117* 77* 62* 83* 132* 169    CBG: Recent Labs  Lab 03/07/19 0122 03/07/19 1110 03/07/19 1645 03/07/19 2107 03/08/19 0738  GLUCAP 114* 130* 117* 110* 87    Signed:  Vassie Loll MD.  [facility] 03/08/2019, 11:03 AM

## 2019-04-25 ENCOUNTER — Ambulatory Visit: Payer: Medicaid Other | Admitting: Gastroenterology

## 2019-04-25 ENCOUNTER — Encounter: Payer: Self-pay | Admitting: Gastroenterology

## 2019-05-22 ENCOUNTER — Encounter: Payer: Self-pay | Admitting: Family Medicine

## 2019-05-22 ENCOUNTER — Ambulatory Visit (INDEPENDENT_AMBULATORY_CARE_PROVIDER_SITE_OTHER): Payer: Medicaid Other | Admitting: Family Medicine

## 2019-05-22 ENCOUNTER — Other Ambulatory Visit: Payer: Self-pay

## 2019-05-22 VITALS — BP 138/90 | HR 88 | Temp 98.3°F | Ht 68.0 in | Wt 188.4 lb

## 2019-05-22 DIAGNOSIS — G8929 Other chronic pain: Secondary | ICD-10-CM

## 2019-05-22 DIAGNOSIS — M545 Low back pain: Secondary | ICD-10-CM | POA: Diagnosis not present

## 2019-05-22 DIAGNOSIS — K852 Alcohol induced acute pancreatitis without necrosis or infection: Secondary | ICD-10-CM

## 2019-05-22 DIAGNOSIS — R569 Unspecified convulsions: Secondary | ICD-10-CM

## 2019-05-22 DIAGNOSIS — M62838 Other muscle spasm: Secondary | ICD-10-CM

## 2019-05-22 MED ORDER — FOLIC ACID 1 MG PO TABS: 1.0000 mg | ORAL_TABLET | Freq: Every day | ORAL | 1 refills | Status: DC

## 2019-05-22 MED ORDER — THIAMINE HCL 100 MG PO TABS: 100.0000 mg | ORAL_TABLET | Freq: Every day | ORAL | 1 refills | Status: DC

## 2019-05-22 NOTE — Patient Instructions (Addendum)
Fasting labwork Neurology referral Xrays can be completed at [facility] at your convenience

## 2019-05-22 NOTE — Progress Notes (Signed)
New Patient Office Visit  Subjective:  Patient ID: Timothy Houston, male    DOB: [birth year]  Age: 48 y.o. MRN: [id number]  CC:  Chief Complaint  Patient presents with  . Establish Care  . Difficulty Walking    for six years/fell and hit his head started having seizures and difficulty walking  . Seizures    HPI Timothy Houston presents for seizures-first noted in 2014-no evaluation by neurology, injury to the head-afterwards seizures -1/months, no eeg, CT scan Scoliosis diagnosed childhood-no surgery, no braces-no xrays-no back specialist. Pt states his legs are unstable and he has numbness and weakness in the lower legs. Pt states he drinks a six pack on Friday night. (roommate states pt is a heavy daily drinker)    Past Medical History:  Diagnosis Date  . Ambulates with cane   . Anxiety   . Neuromuscular disorder (HCC)   . Seizures (HCC)     PSH-none  Family History  Problem Relation Age of Onset  . Heart disease Father   age 66yo-heart surgery Sibs-sister-DDD-42yo  Social History   Socioeconomic History  . Marital status: Divorced    Spouse name: Not on file  . Number of children: Not on file  . Years of education: Not on file  . Highest education level: Not on file  Occupational History  . Occupation: disabled  Tobacco Use  . Smoking status: Current Every Day Smoker    Packs/day: 0.50    Types: Cigarettes  . Smokeless tobacco: Never Used  Substance and Sexual Activity  . Alcohol use: Yes    Comment: heavy  . Drug use: Not Currently    Types: Marijuana  . Sexual activity: Not on file  Other Topics Concern  . Not on file  Social History Narrative  . Not on file   Social Determinants of Health  tob use 1/2 x 18 years Marj Drinks-beer 12pk/Friday night Financial Resource Strain:   . Difficulty of Paying Living Expenses: Not on file  Food Insecurity:   . Worried About Programme researcher, broadcasting/film/video in the Last Year: Not on file  . Ran Out of Food in the Last  Year: Not on file  Transportation Needs:   . Lack of Transportation (Medical): Not on file  . Lack of Transportation (Non-Medical): Not on file  Physical Activity:   . Days of Exercise per Week: Not on file  . Minutes of Exercise per Session: Not on file  Stress:   . Feeling of Stress : Not on file  Social Connections:   . Frequency of Communication with Friends and Family: Not on file  . Frequency of Social Gatherings with Friends and Family: Not on file  . Attends Religious Services: Not on file  . Active Member of Clubs or Organizations: Not on file  . Attends Banker Meetings: Not on file  . Marital Status: Not on file  Intimate Partner Violence:   . Fear of Current or Ex-Partner: Not on file  . Emotionally Abused: Not on file  . Physically Abused: Not on file  . Sexually Abused: Not on file    ROS Review of Systems  Respiratory: Negative.   Cardiovascular: Negative.   Gastrointestinal: Negative.   Neurological: Positive for dizziness, tremors, seizures, weakness and numbness.    Objective:   Today's Vitals: BP 138/90 (BP Location: Left Arm, Patient Position: Sitting, Cuff Size: Normal)   Pulse 88   Temp 98.3 F (36.8 C) (Oral)   Ht 5\' 8"  (  1.727 m)   Wt 188 lb 6.4 oz (85.5 kg)   SpO2 97%   BMI 28.65 kg/m   Physical Exam Vitals reviewed.  Constitutional:      Appearance: Normal appearance.  HENT:     Head: Normocephalic and atraumatic.  Cardiovascular:     Rate and Rhythm: Normal rate and regular rhythm.     Pulses: Normal pulses.     Heart sounds: Normal heart sounds.  Musculoskeletal:     Cervical back: Normal range of motion and neck supple.  Neurological:     Mental Status: He is alert and oriented to person, place, and time.     Assessment & Plan:   1. Alcohol-induced acute pancreatitis, unspecified complication status - CBC with Differential - COMPLETE METABOLIC PANEL WITH GFR - Lipid panel - Magnesium - B12 and Folate Panel -  VITAMIN D 25 Hydroxy (Vit-D Deficiency, Fractures) Pt with long term abuse of alcohol-resistance to referral to obtain assistance in quitting 2. Seizures (HCC) - CBC with Differential - COMPLETE METABOLIC PANEL WITH GFR - Lipid panel - Magnesium - B12 and Folate Panel - VITAMIN D 25 Hydroxy (Vit-D Deficiency, Fractures) Neurology referral 3. Chronic midline low back pain without sciatica - Ambulatory referral to Neurology - DG Thoracic Spine 2 View; Future - DG Lumbar Spine Complete; Future - CBC with Differential - COMPLETE METABOLIC PANEL WITH GFR - Lipid panel - Magnesium - B12 and Folate Panel - VITAMIN D 25 Hydroxy (Vit-D Deficiency, Fractures) D/w pt xrays to determine association with LE weakness and back pain 4. 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 gabapentin (NEURONTIN) 800 MG tablet Take 800 mg by mouth 3 (three) times daily.  . [DISCONTINUED] amLODipine (NORVASC) 5 MG tablet Take 1 tablet (5 mg total) by mouth daily. (Patient not taking: Reported on 05/22/2019)  . [DISCONTINUED] chlordiazePOXIDE (LIBRIUM) 10 MG capsule Take 1 tablet by mouth 3 times a day for 3 days; then 1 tablet by mouth twice a day x 3 days; then 1 tablet by mouth daily x 3 days and stop Librium. (Patient not taking: Reported on 05/22/2019)  . [DISCONTINUED] folic acid (FOLVITE) 1 MG tablet Take 1 tablet (1 mg total) by mouth daily. (Patient not taking: Reported on 05/22/2019)  . [DISCONTINUED] nicotine (NICODERM CQ - DOSED IN MG/24 HOURS) 14 mg/24hr patch Place 1 patch (14 mg total) onto the skin daily. (Patient  not taking: Reported on 05/22/2019)  . [DISCONTINUED] ondansetron (ZOFRAN ODT) 8 MG disintegrating tablet Take 1 tablet (8 mg total) by mouth every 8 (eight) hours as needed for nausea or vomiting. (Patient not taking: Reported on 05/22/2019)  . [DISCONTINUED] pantoprazole (PROTONIX) 40 MG tablet Take 1 tablet (40 mg total) by mouth daily before breakfast. (Patient not taking: Reported on 05/22/2019)  . [DISCONTINUED] thiamine 100 MG tablet Take 1 tablet (100 mg total) by mouth daily. (Patient not taking: Reported on 05/22/2019)   No facility-administered encounter medications on file as of 05/22/2019.    Follow-up: after blood work Neurology referral   Hannah Beat, MD

## 2019-05-30 ENCOUNTER — Telehealth (INDEPENDENT_AMBULATORY_CARE_PROVIDER_SITE_OTHER): Payer: Medicaid Other | Admitting: Family Medicine

## 2019-05-30 ENCOUNTER — Other Ambulatory Visit: Payer: Self-pay

## 2019-05-30 ENCOUNTER — Telehealth: Payer: Self-pay | Admitting: Family Medicine

## 2019-05-30 DIAGNOSIS — R569 Unspecified convulsions: Secondary | ICD-10-CM

## 2019-05-30 DIAGNOSIS — G8929 Other chronic pain: Secondary | ICD-10-CM

## 2019-05-30 DIAGNOSIS — M545 Low back pain, unspecified: Secondary | ICD-10-CM

## 2019-05-30 DIAGNOSIS — K852 Alcohol induced acute pancreatitis without necrosis or infection: Secondary | ICD-10-CM

## 2019-05-30 DIAGNOSIS — E119 Type 2 diabetes mellitus without complications: Secondary | ICD-10-CM

## 2019-05-30 NOTE — Patient Instructions (Signed)
labwork xrays

## 2019-05-30 NOTE — Progress Notes (Signed)
Virtual Visit via Telephone Note  I connected with Timothy Houston on 05/30/19 at  9:40 AM EST by telephone and verified that I am speaking with the correct person using two identifiers.  Location: Patient:home Provider : office   I discussed the limitations, risks, security and privacy concerns of performing an evaluation and management service by telephone and the availability of in person appointments. I also discussed with the patient that there may be a patient responsible charge related to this service. The patient expressed understanding and agreed to proceed.   History of Present Illness: Pt with h/o admission for pancreatitis and diabetic ketoacidosis-9/20. Follow up 12/15 for evaluation for seizures. Pt with long history of alcohol use.  Pt with muscle spasms of LE and low back pain noted with difficulty with walking due to instability.     Observations/Objective:  none Assessment and Plan: 1. Chronic midline low back pain without sciatica xrays recommended 2. Seizures ([place]) Concern for relationship with alcohol abuse-neuro referral for evaluation  3. Alcohol-induced acute pancreatitis, unspecified complication status Admission-labwork pending in-reaffirmed with pt need to completed labwork-concern for vitamin deficiency worsening problem  4. Type 2 diabetes mellitus without complication, without long-term current use of insulin ([place]) labwork recommended for follow up  Follow Up Instructions:  1 week -virtual visit to discuss labowork/xrays I discussed the assessment and treatment plan with the patient. The patient was provided an opportunity to ask questions and all were answered. The patient agreed with the plan and demonstrated an understanding of the instructions.   The patient was advised to call back or seek an in-person evaluation if the symptoms worsen or if the condition fails to improve as anticipated.  I provided 8 minutes of non-face-to-face time during this  encounter.   Jazzy Parmer Hannah Beat, MD

## 2019-05-30 NOTE — Telephone Encounter (Signed)
Called to schedule patient 1 week follow up with Dr. Holly Bodily on lab work and xray, patient said he is unsure when he can get his lab work and will call back to schedule follow up once he knows when he can get that done.

## 2019-06-04 LAB — COMPLETE METABOLIC PANEL WITH GFR
AG Ratio: 1.1 (calc) (ref 1.0–2.5)
ALT: 68 U/L — ABNORMAL HIGH (ref 9–46)
AST: 173 U/L — ABNORMAL HIGH (ref 10–40)
Albumin: 3.5 g/dL — ABNORMAL LOW (ref 3.6–5.1)
Alkaline phosphatase (APISO): 188 U/L — ABNORMAL HIGH (ref 36–130)
BUN/Creatinine Ratio: 7 (calc) (ref 6–22)
BUN: 5 mg/dL — ABNORMAL LOW (ref 7–25)
CO2: 23 mmol/L (ref 20–32)
Calcium: 8.6 mg/dL (ref 8.6–10.3)
Chloride: 99 mmol/L (ref 98–110)
Creat: 0.68 mg/dL (ref 0.60–1.35)
GFR, Est African American: 132 mL/min/{1.73_m2} (ref >=60)
GFR, Est Non African American: 114 mL/min/{1.73_m2} (ref >=60)
Globulin: 3.3 g/dL (calc) (ref 1.9–3.7)
Glucose, Bld: 111 mg/dL — ABNORMAL HIGH (ref 65–99)
Potassium: 3.5 mmol/L (ref 3.5–5.3)
Sodium: 138 mmol/L (ref 135–146)
Total Bilirubin: 1.7 mg/dL — ABNORMAL HIGH (ref 0.2–1.2)
Total Protein: 6.8 g/dL (ref 6.1–8.1)

## 2019-06-04 LAB — B12 AND FOLATE PANEL
Folate: 4.4 ng/mL — ABNORMAL LOW
Vitamin B-12: 1052 pg/mL (ref 200–1100)

## 2019-06-04 LAB — CBC WITH DIFFERENTIAL/PLATELET
Absolute Monocytes: 1083 cells/uL — ABNORMAL HIGH (ref 200–950)
Basophils Absolute: 68 cells/uL (ref 0–200)
Basophils Relative: 0.6 %
Eosinophils Absolute: 148 cells/uL (ref 15–500)
Eosinophils Relative: 1.3 %
HCT: 49.2 % (ref 38.5–50.0)
Hemoglobin: 17.8 g/dL — ABNORMAL HIGH (ref 13.2–17.1)
Lymphs Abs: 2052 cells/uL (ref 850–3900)
MCH: 35.7 pg — ABNORMAL HIGH (ref 27.0–33.0)
MCHC: 36.2 g/dL — ABNORMAL HIGH (ref 32.0–36.0)
MCV: 98.6 fL (ref 80.0–100.0)
MPV: 10.7 fL (ref 7.5–12.5)
Monocytes Relative: 9.5 %
Neutro Abs: 8048 cells/uL — ABNORMAL HIGH (ref 1500–7800)
Neutrophils Relative %: 70.6 %
Platelets: 223 10*3/uL (ref 140–400)
RBC: 4.99 10*6/uL (ref 4.20–5.80)
RDW: 14 % (ref 11.0–15.0)
Total Lymphocyte: 18 %
WBC: 11.4 10*3/uL — ABNORMAL HIGH (ref 3.8–10.8)

## 2019-06-04 LAB — MAGNESIUM: Magnesium: 1.2 mg/dL — ABNORMAL LOW (ref 1.5–2.5)

## 2019-06-05 ENCOUNTER — Other Ambulatory Visit: Payer: Self-pay | Admitting: Family Medicine

## 2019-06-05 MED ORDER — MAGNESIUM GLUCONATE 30 MG PO TABS: 30.0000 mg | ORAL_TABLET | Freq: Two times a day (BID) | ORAL | 5 refills | Status: DC

## 2019-06-05 MED ORDER — FOLIC ACID 1 MG PO TABS: 1.0000 mg | ORAL_TABLET | Freq: Every day | ORAL | 1 refills | Status: DC

## 2019-06-06 ENCOUNTER — Telehealth (INDEPENDENT_AMBULATORY_CARE_PROVIDER_SITE_OTHER): Payer: Medicaid Other | Admitting: Family Medicine

## 2019-06-06 ENCOUNTER — Encounter: Payer: Self-pay | Admitting: Family Medicine

## 2019-06-06 ENCOUNTER — Other Ambulatory Visit: Payer: Self-pay

## 2019-06-06 ENCOUNTER — Telehealth: Payer: Self-pay | Admitting: Family Medicine

## 2019-06-06 VITALS — BP 138/90 | Ht 68.0 in | Wt 188.0 lb

## 2019-06-06 DIAGNOSIS — E119 Type 2 diabetes mellitus without complications: Secondary | ICD-10-CM

## 2019-06-06 DIAGNOSIS — F1010 Alcohol abuse, uncomplicated: Secondary | ICD-10-CM | POA: Diagnosis not present

## 2019-06-06 NOTE — Telephone Encounter (Signed)
27mg  would be ok.  Don't forget to have levels redrawn to make sure your are replacing the losses.  Previously Magnesium was given while hospitalized

## 2019-06-06 NOTE — Telephone Encounter (Signed)
Routing to Dr. Corum for advice ? 

## 2019-06-06 NOTE — Telephone Encounter (Signed)
Patient is calling and states the pharmacy does not carry magnesium gluconate (MAGONATE) 30 MG tablet  They only cary 27 mg and 400 mg. Please advise,

## 2019-06-07 NOTE — Progress Notes (Signed)
Virtual Visit via Telephone Note  I connected with Timothy Houston on 06/06/19  at  8:40 AM EST by telephone and verified that I am speaking with the correct person using two identifiers.DOB/address  Location: Patient home Provider: clinic   I discussed the limitations, risks, security and privacy concerns of performing an evaluation and management serv/2012/30ice by telephone and the availability of in person appointments. I also discussed with the patient that there may be a patient responsible charge related to this service. The patient expressed understanding and agreed to proceed.   History of Present Illness: Low folate and magnesium on labwork Elevated WBC, elevated glucose  Observations/Objective: No vs  Assessment and Plan: 1. Type 2 diabetes mellitus without complication, without long-term current use of insulin (HCC) glucose slightly elevated  2. Alcohol abuse D/w pt concerns about low Mag-infusion while hospitalized-oral recommended. Low folate-recommended replacement. Elevated wbc-no abdominal pain-h/o pancreatitis. No urinary concerns, no cough. Offered referral to program to assist with alcohol addictions-pt declined  3. Hypomagnesemia Mag replacement  Follow Up Instructions: labwork -recheck Mag, Folate, cbc, vitd.    I discussed the assessment and treatment plan with the patient. The patient was provided an opportunity to ask questions and all were answered. The patient agreed with the plan and demonstrated an understanding of the instructions.   The patient was advised to call back or seek an in-person evaluation if the symptoms of infection as discussed occur, UC or clinic appt for evaluation   I provided 12 minof non-face-to-face time during this encounter.   Diondre Pulis Hannah Beat, MD

## 2019-06-11 NOTE — Telephone Encounter (Signed)
Patient stated he is taking the 400 mg otc

## 2019-07-24 ENCOUNTER — Other Ambulatory Visit: Payer: Self-pay

## 2019-07-24 ENCOUNTER — Encounter: Payer: Self-pay | Admitting: Neurology

## 2019-07-24 ENCOUNTER — Ambulatory Visit: Payer: Medicaid Other | Admitting: Neurology

## 2019-07-24 VITALS — BP 139/97 | HR 93 | Temp 97.5°F

## 2019-07-24 DIAGNOSIS — R2689 Other abnormalities of gait and mobility: Secondary | ICD-10-CM | POA: Insufficient documentation

## 2019-07-24 DIAGNOSIS — R569 Unspecified convulsions: Secondary | ICD-10-CM | POA: Diagnosis not present

## 2019-07-24 NOTE — Progress Notes (Signed)
PATIENT: Timothy Houston DOB: [birth year]  Chief Complaint  Patient presents with  . Back Pain    He is here with his roommate, Timothy Houston. Back pain and weakness present for years but worse over the last 18 months. Back pain is in low back and radiates down left leg, along with intermittent muscle spasms. States he is unable to walk unassisted.   Marland Kitchen PCP     Corum, Rex Kras, MD      HISTORICAL  Timothy Houston is a 49 year old male, seen in request by his primary care physician Dr. Benny Lennert for evaluation of gait abnormality, seizure-like activity, he is accompanied by his roommate Timothy Houston at today's clinical visit on July 24, 2019.  I have reviewed and summarized the referring note from the referring physician.  Patient is a poor historian, also seems to reluctant to provide detailed history.  He has been a roommate with Timothy Houston for 8 months  He used to work heavy labor, around 2016, he began to noticed gradual onset gait abnormality, he also complains of left leg pain, low back pain, his gait abnormality gradually progress, he came in with a wheelchair today, at home, his friend Timothy Houston usually carry him to transfer, and also the furniture was set up in such a way, he was able to hold onto the furniture take a few steps, and transfer, he denies bowel and bladder incontinence, denies significant upper extremity weakness, he reported at least moderate daily drink  In addition, he complains of frequent seizure-like spells, every 1 to 2 months, he would have recurrent spells, friends reported him had 3 spells in 1 day, he has large amplitude body jerking movement on the ground, "like a fish", he denies tongue biting, incontinence with the spell  On today's examination, he had a variable effort, there are frequent large amplitude bilateral lower extremity jerking movement,  He has never been evaluated by a neurologist in the past, per patient, due to insurance reasons  Lab in Dec 2020:normal C58,  borderline folic acid, CMP, creat 0.68, abnormal liver functional test, elevated AST, ALT, alkaline phosphates, WBC 11.4   REVIEW OF SYSTEMS: Full 14 system review of systems performed and notable only for as above All other review of systems were negative.  ALLERGIES: No Known Allergies  HOME MEDICATIONS: Current Outpatient Medications  Medication Sig Dispense Refill  . folic acid (FOLVITE) 1 MG tablet Take 1 tablet (1 mg total) by mouth daily. 30 tablet 1  . gabapentin (NEURONTIN) 800 MG tablet Take 800 mg by mouth 3 (three) times daily.    . magnesium gluconate (MAGONATE) 30 MG tablet Take 1 tablet (30 mg total) by mouth 2 (two) times daily. 60 tablet 5  . thiamine 100 MG tablet Take 1 tablet (100 mg total) by mouth daily. 30 tablet 1   No current facility-administered medications for this visit.    PAST MEDICAL HISTORY: Past Medical History:  Diagnosis Date  . Ambulates with cane   . Anxiety   . Back pain   . Neuromuscular disorder ([place])   . Seizures ([place])     PAST SURGICAL HISTORY: History reviewed. No pertinent surgical history.  FAMILY HISTORY: Family History  Problem Relation Age of Onset  . Other Mother        died in car accident  . Heart disease Father   . Other Maternal Grandfather        Thinks he had Charcot-Marie-Tooth Disease, along with maternal uncles.    SOCIAL HISTORY:  Social History   Socioeconomic History  . Marital status: Divorced    Spouse name: Not on file  . Number of children: 2  . Years of education: 39  . Highest education level: High school graduate  Occupational History  . Occupation: disabled  Tobacco Use  . Smoking status: Current Every Day Smoker    Packs/day: 0.50    Types: Cigarettes  . Smokeless tobacco: Never Used  Substance and Sexual Activity  . Alcohol use: Yes    Comment: estimates 4-5 beers daily  . Drug use: Yes    Types: Marijuana    Comment: uses nightly  . Sexual activity: Not on file  Other Topics  Concern  . Not on file  Social History Narrative   Lives at home with his roommate.   Right-handed.   No daily caffeine use.   Social Determinants of Health   Financial Resource Strain:   . Difficulty of Paying Living Expenses: Not on file  Food Insecurity:   . Worried About Programme researcher, broadcasting/film/video in the Last Year: Not on file  . Ran Out of Food in the Last Year: Not on file  Transportation Needs:   . Lack of Transportation (Medical): Not on file  . Lack of Transportation (Non-Medical): Not on file  Physical Activity:   . Days of Exercise per Week: Not on file  . Minutes of Exercise per Session: Not on file  Stress:   . Feeling of Stress : Not on file  Social Connections:   . Frequency of Communication with Friends and Family: Not on file  . Frequency of Social Gatherings with Friends and Family: Not on file  . Attends Religious Services: Not on file  . Active Member of Clubs or Organizations: Not on file  . Attends Banker Meetings: Not on file  . Marital Status: Not on file  Intimate Partner Violence:   . Fear of Current or Ex-Partner: Not on file  . Emotionally Abused: Not on file  . Physically Abused: Not on file  . Sexually Abused: Not on file     PHYSICAL EXAM   Vitals:   07/24/19 1415  BP: (!) 139/97  Pulse: 93  Temp: (!) 97.5 F (36.4 C)    Not recorded      There is no height or weight on file to calculate BMI.  PHYSICAL EXAMNIATION:  Gen: NAD, conversant, well nourised, well groomed                     Cardiovascular: Regular rate rhythm, no peripheral edema, warm, nontender. Eyes: Conjunctivae clear without exudates or hemorrhage Neck: Supple, no carotid bruits. Pulmonary: Clear to auscultation bilaterally   NEUROLOGICAL EXAM:  MENTAL STATUS: Speech:    Speech is normal; fluent and spontaneous with normal comprehension.  Cognition:     Orientation to time, place and person     Normal recent and remote memory     Normal Attention  span and concentration     Normal Language, naming, repeating,spontaneous speech     Fund of knowledge   CRANIAL NERVES: CN II: Visual fields are full to confrontation. Pupils are round equal and briskly reactive to light. CN III, IV, VI: extraocular movement are normal. No ptosis. CN V: Facial sensation is intact to light touch CN VII: Face is symmetric with normal eye closure  CN VIII: Hearing is normal to causal conversation. CN IX, X: Phonation is normal. CN XI: Head turning and shoulder  shrug are intact  MOTOR: Variable effort on examinations, upper extremity motor strength felt to be normal, lower extremity has frequent large amplitude tremor, but with distraction, he was able to move bilateral lower extremity fairly freely, motor strength is at least 4+  REFLEXES: Reflexes are 2+ and symmetric at the biceps, triceps, knees, and absent at ankles. Plantar responses are flexor.  SENSORY: Intact to vibratory sensation at toes, but reported decreased light touch, pinprick to knee level  COORDINATION: There is no trunk or upper extremity dysmetria noted, variable effort on lower extremity examination, large amplitude bilateral lower extremity tremor which is present during sitting still, most obvious holding certain posture  GAIT/STANCE: Need assistant to get up from seated position, stiff, unsteady gait   DIAGNOSTIC DATA (LABS, IMAGING, TESTING) - I reviewed patient records, labs, notes, testing and imaging myself where available.   ASSESSMENT AND PLAN  Carnie Bruemmer is a 49 y.o. male   Poor historian, reported seizure-like activity, body jerking like a fish on the ground, 3 episodes in 1 day Also variable effort on examination, large amplitude bilateral lower extremity tremor,  Not sure the etiology, proceed with MRI of the brain, cervical, thoracic, lumbar spine,  EEG  Laboratory evaluation including CPK, copper level   Timothy Houston, M.D. Ph.D.  [facility] [address], [address] [place], [place] [address] Ph: [phone] Fax: [phone]  CC: Wandra Feinstein, MD

## 2019-07-25 ENCOUNTER — Telehealth: Payer: Self-pay | Admitting: Neurology

## 2019-07-25 NOTE — Telephone Encounter (Signed)
Medicaid order sent to GI. They will obtain the auth and reach out to the patient to schedule.  

## 2019-07-26 LAB — TSH: TSH: 5.57 u[IU]/mL — ABNORMAL HIGH (ref 0.450–4.500)

## 2019-07-26 LAB — CK: Total CK: 35 U/L — ABNORMAL LOW (ref 49–439)

## 2019-07-26 LAB — COPPER, SERUM: Copper: 84 ug/dL (ref 69–132)

## 2019-07-30 ENCOUNTER — Telehealth: Payer: Self-pay | Admitting: Neurology

## 2019-07-30 ENCOUNTER — Telehealth: Payer: Self-pay

## 2019-07-30 ENCOUNTER — Other Ambulatory Visit: Payer: Self-pay | Admitting: Family Medicine

## 2019-07-30 DIAGNOSIS — E039 Hypothyroidism, unspecified: Secondary | ICD-10-CM

## 2019-07-30 MED ORDER — LEVOTHYROXINE SODIUM 50 MCG PO TABS: 50.0000 ug | ORAL_TABLET | Freq: Every day | ORAL | 1 refills | Status: AC

## 2019-07-30 NOTE — Telephone Encounter (Signed)
I spoke to the patient. He verbalized understanding of his lab results and will follow up with his PCP.

## 2019-07-30 NOTE — Telephone Encounter (Signed)
Spoke with pt ,will start synthroid, verbalized understanding to instructions, will f/u with labs in 6 weeks.

## 2019-07-30 NOTE — Telephone Encounter (Signed)
Please call patient, laboratory evaluation showed mild elevated TSH, could indicate hypothyroidism, I have forwarded laboratory results to his primary care physician Dr. Dorette Grate, he may continue follow-up with her for potentially repeat testing later  Rest of the laboratory showed no significant abnormalities.

## 2019-08-02 ENCOUNTER — Ambulatory Visit: Payer: Medicaid Other | Admitting: Neurology

## 2019-08-02 DIAGNOSIS — R569 Unspecified convulsions: Secondary | ICD-10-CM

## 2019-08-02 DIAGNOSIS — R2689 Other abnormalities of gait and mobility: Secondary | ICD-10-CM

## 2019-08-16 NOTE — Procedures (Signed)
   HISTORY: 49 years old presented with seizure like activities.    TECHNIQUE:  This is a routine 16 channel EEG recording with one channel devoted to a limited EKG recording.  It was performed during wakefulness, drowsiness and asleep.  Hyperventilation and photic stimulation were performed as activating procedures.  There are frequent eye movement artifact  Upon maximum arousal, posterior dominant waking rhythm consistent of rhythmic alpha range activity. Activities are symmetric over the bilateral posterior derivations and attenuated with eye opening.  Hyperventilation produced mild/moderate buildup with higher amplitude and the slower activities noted.  Photic stimulation did not alter the tracing.  During EEG recording, patient developed drowsiness and entered no deeper stage of sleep was achieved.  During EEG recording, there was no epileptiform discharge noted.  EKG demonstrate sinus rhythm, with heart rate of 90 bpm  CONCLUSION: This is a  normal awake EEG.  There is no electrodiagnostic evidence of epileptiform discharge.  Levert Feinstein, M.D. Ph.D.  [facility] [address] [place], [place] [address] Phone: [phone] Fax:      [phone]

## 2019-09-03 ENCOUNTER — Emergency Department (HOSPITAL_COMMUNITY): Payer: Medicaid Other

## 2019-09-03 ENCOUNTER — Other Ambulatory Visit: Payer: Self-pay

## 2019-09-03 ENCOUNTER — Encounter (HOSPITAL_COMMUNITY): Payer: Self-pay

## 2019-09-03 ENCOUNTER — Inpatient Hospital Stay (HOSPITAL_COMMUNITY)
Admission: EM | Admit: 2019-09-03 | Discharge: 2019-09-06 | DRG: 432 | Disposition: E | Payer: Medicaid Other | Attending: Internal Medicine | Admitting: Internal Medicine

## 2019-09-03 DIAGNOSIS — G40909 Epilepsy, unspecified, not intractable, without status epilepticus: Secondary | ICD-10-CM | POA: Diagnosis present

## 2019-09-03 DIAGNOSIS — N179 Acute kidney failure, unspecified: Secondary | ICD-10-CM | POA: Diagnosis present

## 2019-09-03 DIAGNOSIS — R600 Localized edema: Secondary | ICD-10-CM | POA: Diagnosis present

## 2019-09-03 DIAGNOSIS — D689 Coagulation defect, unspecified: Secondary | ICD-10-CM | POA: Diagnosis present

## 2019-09-03 DIAGNOSIS — Z515 Encounter for palliative care: Secondary | ICD-10-CM | POA: Diagnosis not present

## 2019-09-03 DIAGNOSIS — F419 Anxiety disorder, unspecified: Secondary | ICD-10-CM | POA: Diagnosis present

## 2019-09-03 DIAGNOSIS — D6959 Other secondary thrombocytopenia: Secondary | ICD-10-CM | POA: Diagnosis present

## 2019-09-03 DIAGNOSIS — Z7189 Other specified counseling: Secondary | ICD-10-CM | POA: Diagnosis not present

## 2019-09-03 DIAGNOSIS — K2920 Alcoholic gastritis without bleeding: Secondary | ICD-10-CM | POA: Diagnosis present

## 2019-09-03 DIAGNOSIS — K8020 Calculus of gallbladder without cholecystitis without obstruction: Secondary | ICD-10-CM | POA: Diagnosis present

## 2019-09-03 DIAGNOSIS — K922 Gastrointestinal hemorrhage, unspecified: Secondary | ICD-10-CM

## 2019-09-03 DIAGNOSIS — Y848 Other medical procedures as the cause of abnormal reaction of the patient, or of later complication, without mention of misadventure at the time of the procedure: Secondary | ICD-10-CM | POA: Diagnosis not present

## 2019-09-03 DIAGNOSIS — R17 Unspecified jaundice: Secondary | ICD-10-CM

## 2019-09-03 DIAGNOSIS — I959 Hypotension, unspecified: Secondary | ICD-10-CM | POA: Diagnosis not present

## 2019-09-03 DIAGNOSIS — D6489 Other specified anemias: Secondary | ICD-10-CM | POA: Diagnosis present

## 2019-09-03 DIAGNOSIS — K7011 Alcoholic hepatitis with ascites: Secondary | ICD-10-CM | POA: Diagnosis present

## 2019-09-03 DIAGNOSIS — K7682 Hepatic encephalopathy: Secondary | ICD-10-CM

## 2019-09-03 DIAGNOSIS — M545 Low back pain, unspecified: Secondary | ICD-10-CM

## 2019-09-03 DIAGNOSIS — K91840 Postprocedural hemorrhage and hematoma of a digestive system organ or structure following a digestive system procedure: Secondary | ICD-10-CM | POA: Diagnosis not present

## 2019-09-03 DIAGNOSIS — Z79899 Other long term (current) drug therapy: Secondary | ICD-10-CM

## 2019-09-03 DIAGNOSIS — K7200 Acute and subacute hepatic failure without coma: Secondary | ICD-10-CM | POA: Diagnosis present

## 2019-09-03 DIAGNOSIS — R140 Abdominal distension (gaseous): Secondary | ICD-10-CM | POA: Diagnosis present

## 2019-09-03 DIAGNOSIS — E039 Hypothyroidism, unspecified: Secondary | ICD-10-CM | POA: Diagnosis present

## 2019-09-03 DIAGNOSIS — R000 Tachycardia, unspecified: Secondary | ICD-10-CM | POA: Diagnosis present

## 2019-09-03 DIAGNOSIS — Z20822 Contact with and (suspected) exposure to covid-19: Secondary | ICD-10-CM | POA: Diagnosis present

## 2019-09-03 DIAGNOSIS — E11649 Type 2 diabetes mellitus with hypoglycemia without coma: Secondary | ICD-10-CM | POA: Diagnosis not present

## 2019-09-03 DIAGNOSIS — K760 Fatty (change of) liver, not elsewhere classified: Secondary | ICD-10-CM | POA: Diagnosis present

## 2019-09-03 DIAGNOSIS — K767 Hepatorenal syndrome: Secondary | ICD-10-CM | POA: Diagnosis present

## 2019-09-03 DIAGNOSIS — E1165 Type 2 diabetes mellitus with hyperglycemia: Secondary | ICD-10-CM | POA: Diagnosis present

## 2019-09-03 DIAGNOSIS — Z66 Do not resuscitate: Secondary | ICD-10-CM | POA: Diagnosis not present

## 2019-09-03 DIAGNOSIS — M549 Dorsalgia, unspecified: Secondary | ICD-10-CM | POA: Diagnosis present

## 2019-09-03 DIAGNOSIS — M62838 Other muscle spasm: Secondary | ICD-10-CM

## 2019-09-03 DIAGNOSIS — Y904 Blood alcohol level of 80-99 mg/100 ml: Secondary | ICD-10-CM | POA: Diagnosis present

## 2019-09-03 DIAGNOSIS — F17210 Nicotine dependence, cigarettes, uncomplicated: Secondary | ICD-10-CM | POA: Diagnosis present

## 2019-09-03 DIAGNOSIS — K7290 Hepatic failure, unspecified without coma: Secondary | ICD-10-CM | POA: Diagnosis not present

## 2019-09-03 DIAGNOSIS — Z79890 Hormone replacement therapy (postmenopausal): Secondary | ICD-10-CM

## 2019-09-03 DIAGNOSIS — F1010 Alcohol abuse, uncomplicated: Secondary | ICD-10-CM | POA: Diagnosis present

## 2019-09-03 DIAGNOSIS — G8929 Other chronic pain: Secondary | ICD-10-CM | POA: Diagnosis present

## 2019-09-03 LAB — CBC WITH DIFFERENTIAL/PLATELET
Abs Immature Granulocytes: 0.15 10*3/uL — ABNORMAL HIGH (ref 0.00–0.07)
Basophils Absolute: 0 10*3/uL (ref 0.0–0.1)
Basophils Relative: 0 %
Eosinophils Absolute: 0 10*3/uL (ref 0.0–0.5)
Eosinophils Relative: 0 %
HCT: 29.9 % — ABNORMAL LOW (ref 39.0–52.0)
Hemoglobin: 10.7 g/dL — ABNORMAL LOW (ref 13.0–17.0)
Immature Granulocytes: 1 %
Lymphocytes Relative: 7 %
Lymphs Abs: 0.8 10*3/uL (ref 0.7–4.0)
MCH: 34.9 pg — ABNORMAL HIGH (ref 26.0–34.0)
MCHC: 35.8 g/dL (ref 30.0–36.0)
MCV: 97.4 fL (ref 80.0–100.0)
Monocytes Absolute: 1.2 10*3/uL — ABNORMAL HIGH (ref 0.1–1.0)
Monocytes Relative: 10 %
Neutro Abs: 9.4 10*3/uL — ABNORMAL HIGH (ref 1.7–7.7)
Neutrophils Relative %: 82 %
Platelets: 82 10*3/uL — ABNORMAL LOW (ref 150–400)
RBC: 3.07 MIL/uL — ABNORMAL LOW (ref 4.22–5.81)
RDW: 16 % — ABNORMAL HIGH (ref 11.5–15.5)
WBC: 11.6 10*3/uL — ABNORMAL HIGH (ref 4.0–10.5)
nRBC: 0 % (ref 0.0–0.2)

## 2019-09-03 LAB — CBC
HCT: 28.9 % — ABNORMAL LOW (ref 39.0–52.0)
Hemoglobin: 10.2 g/dL — ABNORMAL LOW (ref 13.0–17.0)
MCH: 34.3 pg — ABNORMAL HIGH (ref 26.0–34.0)
MCHC: 35.3 g/dL (ref 30.0–36.0)
MCV: 97.3 fL (ref 80.0–100.0)
Platelets: 80 10*3/uL — ABNORMAL LOW (ref 150–400)
RBC: 2.97 MIL/uL — ABNORMAL LOW (ref 4.22–5.81)
RDW: 15.9 % — ABNORMAL HIGH (ref 11.5–15.5)
WBC: 10.4 10*3/uL (ref 4.0–10.5)
nRBC: 0 % (ref 0.0–0.2)

## 2019-09-03 LAB — SARS CORONAVIRUS 2 (TAT 6-24 HRS): SARS Coronavirus 2: NEGATIVE

## 2019-09-03 LAB — COMPREHENSIVE METABOLIC PANEL
ALT: 126 U/L — ABNORMAL HIGH (ref 0–44)
ALT: 119 U/L — ABNORMAL HIGH (ref 0–44)
AST: 325 U/L — ABNORMAL HIGH (ref 15–41)
AST: 307 U/L — ABNORMAL HIGH (ref 15–41)
Albumin: 1.9 g/dL — ABNORMAL LOW (ref 3.5–5.0)
Albumin: 1.8 g/dL — ABNORMAL LOW (ref 3.5–5.0)
Alkaline Phosphatase: 217 U/L — ABNORMAL HIGH (ref 38–126)
Alkaline Phosphatase: 212 U/L — ABNORMAL HIGH (ref 38–126)
Anion gap: 19 — ABNORMAL HIGH (ref 5–15)
Anion gap: 16 — ABNORMAL HIGH (ref 5–15)
BUN: 13 mg/dL (ref 6–20)
BUN: 14 mg/dL (ref 6–20)
CO2: 27 mmol/L (ref 22–32)
CO2: 27 mmol/L (ref 22–32)
Calcium: 10.4 mg/dL — ABNORMAL HIGH (ref 8.9–10.3)
Calcium: 10 mg/dL (ref 8.9–10.3)
Chloride: 87 mmol/L — ABNORMAL LOW (ref 98–111)
Chloride: 86 mmol/L — ABNORMAL LOW (ref 98–111)
Creatinine, Ser: <0.3 mg/dL — ABNORMAL LOW (ref 0.61–1.24)
Creatinine, Ser: 0.72 mg/dL (ref 0.61–1.24)
GFR calc Af Amer: >60 mL/min (ref >60)
GFR calc non Af Amer: >60 mL/min (ref >60)
Glucose, Bld: 113 mg/dL — ABNORMAL HIGH (ref 70–99)
Glucose, Bld: 110 mg/dL — ABNORMAL HIGH (ref 70–99)
Potassium: 3.8 mmol/L (ref 3.5–5.1)
Potassium: 2.8 mmol/L — ABNORMAL LOW (ref 3.5–5.1)
Sodium: 133 mmol/L — ABNORMAL LOW (ref 135–145)
Sodium: 129 mmol/L — ABNORMAL LOW (ref 135–145)
Total Bilirubin: 21.1 mg/dL (ref 0.3–1.2)
Total Bilirubin: 20.9 mg/dL (ref 0.3–1.2)
Total Protein: 7 g/dL (ref 6.5–8.1)
Total Protein: 6.8 g/dL (ref 6.5–8.1)

## 2019-09-03 LAB — HEPATITIS PANEL, ACUTE
HCV Ab: NONREACTIVE
Hep A IgM: NONREACTIVE
Hep B C IgM: NONREACTIVE
Hepatitis B Surface Ag: NONREACTIVE

## 2019-09-03 LAB — URINALYSIS, ROUTINE W REFLEX MICROSCOPIC
Glucose, UA: 50 mg/dL — AB
Ketones, ur: NEGATIVE mg/dL
Leukocytes,Ua: NEGATIVE
Nitrite: NEGATIVE
Protein, ur: 100 mg/dL — AB
Specific Gravity, Urine: 1.023 (ref 1.005–1.030)
pH: 5 (ref 5.0–8.0)

## 2019-09-03 LAB — PROTIME-INR
INR: 2 — ABNORMAL HIGH (ref 0.8–1.2)
Prothrombin Time: 22.7 seconds — ABNORMAL HIGH (ref 11.4–15.2)

## 2019-09-03 LAB — POC SARS CORONAVIRUS 2 AG -  ED: SARS Coronavirus 2 Ag: NEGATIVE

## 2019-09-03 LAB — TSH: TSH: 3.012 u[IU]/mL (ref 0.350–4.500)

## 2019-09-03 LAB — MAGNESIUM: Magnesium: 1.2 mg/dL — ABNORMAL LOW (ref 1.7–2.4)

## 2019-09-03 LAB — LIPASE, BLOOD: Lipase: 101 U/L — ABNORMAL HIGH (ref 11–51)

## 2019-09-03 LAB — AMMONIA: Ammonia: 85 umol/L — ABNORMAL HIGH (ref 9–35)

## 2019-09-03 LAB — PHOSPHORUS: Phosphorus: 4.2 mg/dL (ref 2.5–4.6)

## 2019-09-03 LAB — ETHANOL: Alcohol, Ethyl (B): 89 mg/dL — ABNORMAL HIGH (ref <10)

## 2019-09-03 LAB — ACETAMINOPHEN LEVEL: Acetaminophen (Tylenol), Serum: <10 ug/mL — ABNORMAL LOW (ref 10–30)

## 2019-09-03 MED ORDER — INFLUENZA VAC SPLIT QUAD 0.5 ML IM SUSY: 0.5000 mL | PREFILLED_SYRINGE | INTRAMUSCULAR | Status: DC

## 2019-09-03 MED ORDER — ONDANSETRON HCL 4 MG/2ML IJ SOLN
4.0000 mg | Freq: Once | INTRAMUSCULAR | Status: AC
  Administered 2019-09-03: 4 mg via INTRAVENOUS
  Filled 2019-09-03: qty 2

## 2019-09-03 MED ORDER — HYDROMORPHONE HCL 1 MG/ML IJ SOLN
0.5000 mg | INTRAMUSCULAR | Status: DC | PRN
  Administered 2019-09-03 – 2019-09-04 (×3): 0.5 mg via INTRAVENOUS
  Filled 2019-09-03 (×3): qty 0.5

## 2019-09-03 MED ORDER — ADULT MULTIVITAMIN W/MINERALS CH
1.0000 | ORAL_TABLET | Freq: Every day | ORAL | Status: DC
  Administered 2019-09-03: 1 via ORAL
  Filled 2019-09-03 (×2): qty 1

## 2019-09-03 MED ORDER — MORPHINE SULFATE (PF) 4 MG/ML IV SOLN
4.0000 mg | Freq: Once | INTRAVENOUS | Status: AC
  Administered 2019-09-03: 4 mg via INTRAVENOUS
  Filled 2019-09-03: qty 1

## 2019-09-03 MED ORDER — ONDANSETRON HCL 4 MG PO TABS: 4.0000 mg | ORAL_TABLET | Freq: Four times a day (QID) | ORAL | Status: DC | PRN

## 2019-09-03 MED ORDER — FOLIC ACID 1 MG PO TABS
1.0000 mg | ORAL_TABLET | Freq: Every day | ORAL | Status: DC
  Administered 2019-09-03: 18:00:00 1 mg via ORAL
  Filled 2019-09-03 (×2): qty 1

## 2019-09-03 MED ORDER — LACTULOSE ENEMA
300.0000 mL | Freq: Once | ORAL | Status: AC
  Administered 2019-09-03: 300 mL via RECTAL
  Filled 2019-09-03: qty 300

## 2019-09-03 MED ORDER — SODIUM CHLORIDE 0.9% FLUSH: 3.0000 mL | Freq: Once | INTRAVENOUS | Status: DC

## 2019-09-03 MED ORDER — SODIUM CHLORIDE 0.9 % IV BOLUS
2000.0000 mL | Freq: Once | INTRAVENOUS | Status: AC
  Administered 2019-09-03: 2000 mL via INTRAVENOUS

## 2019-09-03 MED ORDER — THIAMINE HCL 100 MG/ML IJ SOLN
100.0000 mg | Freq: Every day | INTRAMUSCULAR | Status: DC
  Administered 2019-09-04 – 2019-09-05 (×2): 100 mg via INTRAVENOUS
  Filled 2019-09-03 (×2): qty 2

## 2019-09-03 MED ORDER — LORAZEPAM 1 MG PO TABS: 1.0000 mg | ORAL_TABLET | ORAL | Status: DC | PRN

## 2019-09-03 MED ORDER — LACTULOSE ENEMA
300.0000 mL | Freq: Once | ORAL | Status: DC
  Filled 2019-09-03: qty 300

## 2019-09-03 MED ORDER — THIAMINE HCL 100 MG PO TABS
100.0000 mg | ORAL_TABLET | Freq: Every day | ORAL | Status: DC
  Administered 2019-09-03: 18:00:00 100 mg via ORAL
  Filled 2019-09-03 (×2): qty 1

## 2019-09-03 MED ORDER — GABAPENTIN 400 MG PO CAPS
800.0000 mg | ORAL_CAPSULE | Freq: Three times a day (TID) | ORAL | Status: DC
  Administered 2019-09-03 – 2019-09-04 (×3): 800 mg via ORAL
  Filled 2019-09-03 (×4): qty 2

## 2019-09-03 MED ORDER — PANTOPRAZOLE SODIUM 40 MG IV SOLR
40.0000 mg | INTRAVENOUS | Status: DC
  Administered 2019-09-03: 40 mg via INTRAVENOUS
  Filled 2019-09-03: qty 40

## 2019-09-03 MED ORDER — FOLIC ACID 1 MG PO TABS: 1.0000 mg | ORAL_TABLET | Freq: Every day | ORAL | Status: DC

## 2019-09-03 MED ORDER — SODIUM CHLORIDE 0.9 % IV BOLUS
500.0000 mL | Freq: Once | INTRAVENOUS | Status: AC
  Administered 2019-09-03: 500 mL via INTRAVENOUS

## 2019-09-03 MED ORDER — ONDANSETRON HCL 4 MG/2ML IJ SOLN: 4.0000 mg | Freq: Four times a day (QID) | INTRAMUSCULAR | Status: DC | PRN

## 2019-09-03 MED ORDER — SODIUM CHLORIDE 0.9 % IV SOLN: INTRAVENOUS | Status: DC

## 2019-09-03 MED ORDER — LORAZEPAM 2 MG/ML IJ SOLN
1.0000 mg | INTRAMUSCULAR | Status: DC | PRN
  Administered 2019-09-03 – 2019-09-04 (×2): 1 mg via INTRAVENOUS
  Filled 2019-09-03 (×2): qty 1

## 2019-09-03 MED ORDER — LEVOTHYROXINE SODIUM 25 MCG PO TABS
50.0000 ug | ORAL_TABLET | Freq: Every day | ORAL | Status: DC
  Administered 2019-09-04: 50 ug via ORAL
  Filled 2019-09-03: qty 1

## 2019-09-03 MED ORDER — THIAMINE HCL 100 MG PO TABS: 100.0000 mg | ORAL_TABLET | Freq: Every day | ORAL | Status: DC

## 2019-09-03 NOTE — ED Triage Notes (Addendum)
Pt reports abdominal pain and diarrhea since taking covid shot on Thursday. Diarrhea has resolved. Reports generalized body aches. Pt states he cant keep anything down

## 2019-09-03 NOTE — ED Notes (Signed)
Date and time results received: 2019/09/04 12:04 PM  (use smartphrase ".now" to insert current time)  Test: total bilirubin Critical Value: 21.1  Name of Provider Notified: Idol. PA  Orders Received? Or Actions Taken? Na

## 2019-09-03 NOTE — H&P (Addendum)
History and Physical    Timothy Houston WUJ:[id number] DOB: 04-Jan-1971 DOA: 08/28/2019  PCP: Wandra Feinstein, MD   Patient coming from: Home  Chief Complaint: Abdominal pain  HPI: Timothy Houston is a 49 y.o. male with medical history significant for alcohol abuse with prior pancreatitis, type 2 diabetes, neuromuscular disorder, and seizure disorder who states that he received his first Covid vaccine approximately 5 days ago after which point in time he began to develop some abdominal pain, nausea and vomiting as well as diarrhea and jaundice.  He states that his diarrhea has resolved and now he has generalized myalgias and abdominal distention.  He denies any fevers or chills.  He reports drinking half a bottle of vodka daily and states that he is under a great deal of stress.  He denies any chest pain or shortness of breath.   ED Course: Vital signs demonstrate sinus tachycardia noted on EKG.  He is afebrile.  Noted to have sodium of 133 and alkaline phosphatase of 217 with lipase of 101.  His total bilirubin is 21.1 and ammonia level is 85.  CT abdominal imaging does not appear to demonstrate pancreatitis, however he has low-density ascites as well as diffuse hepatic steatosis and some possible compression on the IVC.  He is noted to be jaundiced and has received some IV fluid.  His alcohol levels are currently 89.  Covid testing pending.  Review of Systems: Cannot be obtained due to patient condition.  Past Medical History:  Diagnosis Date  . Ambulates with cane   . Anxiety   . Back pain   . Neuromuscular disorder (HCC)   . Seizures (HCC)     History reviewed. No pertinent surgical history.   reports that he has been smoking cigarettes. He has been smoking about 0.50 packs per day. He has never used smokeless tobacco. He reports current alcohol use. He reports current drug use. Drug: Marijuana.  No Known Allergies  Family History  Problem Relation Age of Onset  . Other Mother    died in car accident  . Heart disease Father   . Other Maternal Grandfather        Thinks he had Charcot-Marie-Tooth Disease, along with maternal uncles.    Prior to Admission medications   Medication Sig Start Date End Date Taking? Authorizing Provider  folic acid (FOLVITE) 1 MG tablet Take 1 tablet (1 mg total) by mouth daily. 06/05/19   Corum, Minerva Fester, MD  gabapentin (NEURONTIN) 800 MG tablet Take 800 mg by mouth 3 (three) times daily.    [provider]  levothyroxine (SYNTHROID) 50 MCG tablet Take 1 tablet (50 mcg total) by mouth daily. 07/30/19   Corum, Minerva Fester, MD  magnesium gluconate (MAGONATE) 30 MG tablet Take 1 tablet (30 mg total) by mouth 2 (two) times daily. 06/05/19   Wandra Feinstein, MD  thiamine 100 MG tablet Take 1 tablet (100 mg total) by mouth daily. 05/22/19   Wandra Feinstein, MD    Physical Exam: Vitals:   08/22/2019 1105 08/23/2019 1106 08/19/2019 1345  BP: 110/73    Pulse: (!) 125  (!) 113  Resp: 20  (!) 21  Temp: 98.4 F (36.9 C)    TempSrc: Oral    SpO2: 95%  92%  Weight:  88.9 kg   Height:  5\' 8"  (1.727 m)     Constitutional: NAD, appears jaundiced and lethargic, difficult historian Vitals:   09/02/2019 1105 08/06/2019 1106 08/09/2019 1345  BP: 110/73  Pulse: (!) 125  (!) 113  Resp: 20  (!) 21  Temp: 98.4 F (36.9 C)    TempSrc: Oral    SpO2: 95%  92%  Weight:  88.9 kg   Height:  5\' 8"  (1.727 m)    Eyes: lids and conjunctivae normal ENMT: Mucous membranes are dry Neck: normal, supple Respiratory: clear to auscultation bilaterally. Normal respiratory effort. No accessory muscle use.  Currently on room air. Cardiovascular: Regular rate and rhythm, no murmurs. No extremity edema. Abdomen: no tenderness, moderate to severe distention noted with tympany.  Bowel sounds difficult to auscultate. Musculoskeletal:  No joint deformity upper and lower extremities.   Skin: Appears jaundiced Psychiatric: Flat affect and difficult to assess.  Labs on Admission:  I have personally reviewed following labs and imaging studies  CBC: Recent Labs  Lab 08/20/2019 1138  WBC 11.6*  NEUTROABS 9.4*  HGB 10.7*  HCT 29.9*  MCV 97.4  PLT 82*   Basic Metabolic Panel: Recent Labs  Lab 09/04/2019 1137  NA 133*  K 3.8  CL 87*  CO2 27  GLUCOSE 113*  BUN 13  CREATININE <0.30*  CALCIUM 10.4*   GFR: CrCl cannot be calculated (This lab value cannot be used to calculate CrCl because it is not a number: <0.30). Liver Function Tests: Recent Labs  Lab 08/24/2019 1137  AST 325*  ALT 126*  ALKPHOS 217*  BILITOT 21.1*  PROT 7.0  ALBUMIN 1.9*   Recent Labs  Lab 08/16/2019 1137  LIPASE 101*   Recent Labs  Lab 08/17/2019 1315  AMMONIA 85*   Coagulation Profile: Recent Labs  Lab 08/22/2019 1315  INR 2.0*   Cardiac Enzymes: No results for input(s): CKTOTAL, CKMB, CKMBINDEX, TROPONINI in the last 168 hours. BNP (last 3 results) No results for input(s): PROBNP in the last 8760 hours. HbA1C: No results for input(s): HGBA1C in the last 72 hours. CBG: No results for input(s): GLUCAP in the last 168 hours. Lipid Profile: No results for input(s): CHOL, HDL, LDLCALC, TRIG, CHOLHDL, LDLDIRECT in the last 72 hours. Thyroid Function Tests: No results for input(s): TSH, T4TOTAL, FREET4, T3FREE, THYROIDAB in the last 72 hours. Anemia Panel: No results for input(s): VITAMINB12, FOLATE, FERRITIN, TIBC, IRON, RETICCTPCT in the last 72 hours. Urine analysis:    Component Value Date/Time   COLORURINE AMBER (A) 03/02/2019 1620   APPEARANCEUR HAZY (A) 03/02/2019 1620   LABSPEC 1.030 03/02/2019 1620   PHURINE 5.0 03/02/2019 1620   GLUCOSEU >=500 (A) 03/02/2019 1620   HGBUR MODERATE (A) 03/02/2019 1620   BILIRUBINUR NEGATIVE 03/02/2019 1620   KETONESUR 5 (A) 03/02/2019 1620   PROTEINUR >=300 (A) 03/02/2019 1620   NITRITE NEGATIVE 03/02/2019 1620   LEUKOCYTESUR NEGATIVE 03/02/2019 1620    Radiological Exams on Admission: CT ABDOMEN PELVIS WO  CONTRAST  Result Date: 08/31/2019 CLINICAL DATA:  Abdominal pain with diarrhea and jaundice since COVID-19 vaccination 4 days ago. EXAM: CT ABDOMEN AND PELVIS WITHOUT CONTRAST TECHNIQUE: Multidetector CT imaging of the abdomen and pelvis was performed following the standard protocol without IV contrast. COMPARISON:  Abdominopelvic CT 03/02/2019. FINDINGS: Lower chest: Stable band like density in the right lower lobe, likely scarring or chronic atelectasis. The lung bases are otherwise clear. No significant pleural or pericardial effusion. Hepatobiliary: Diffuse hepatic steatosis is again noted, and this now appears more heterogeneous in distribution. There is apparent focal fat centrally in the liver, extending into the caudate lobe and measuring up to 5.1 x 3.4 cm transverse on image 29/3. This  extends up to 8.7 cm in length on coronal image 74/6 and exerts mass effect on the IVC and hepatic veins which appear patent. Multiple gallstones are again noted. The gallbladder is distended without wall thickening or surrounding inflammation. No apparent intrahepatic or extrahepatic biliary dilatation. Pancreas: Unremarkable. No pancreatic ductal dilatation or surrounding inflammatory changes. Spleen: Normal in size without focal abnormality. Adrenals/Urinary Tract: Both adrenal glands appear normal. The kidneys appear unremarkable as imaged in the noncontrast state. No evidence of urinary tract calculus, hydronephrosis or asymmetric perinephric soft tissue stranding. Mild bladder wall thickening, likely related to incomplete distension. Stomach/Bowel: No evidence of bowel wall thickening, distention or surrounding inflammatory change. The appendix is not clearly seen, but there is no focal pericecal inflammation. Vascular/Lymphatic: Multiple prominent retroperitoneal lymph nodes are similar to the previous study and likely reactive. As above, focal fat within the caudate lobe exerts mass effect on the IVC and hepatic  veins. No evidence for vascular occlusion on noncontrast imaging. Reproductive: The prostate gland and seminal vesicles appear normal. Other: New small amount of low-density ascites in both pericolic gutters and the pelvis. Mild generalized edema throughout the subcutaneous and intra-abdominal fat. No focal extraluminal fluid or air collections identified. Musculoskeletal: No acute or significant osseous findings. Stable Schmorl's node involving the superior endplate of T11. IMPRESSION: 1. New small amount of low-density ascites and generalized edema throughout the subcutaneous and intra-abdominal fat consistent with anasarca. No focal extraluminal fluid collections. 2. Diffuse hepatic steatosis with focal fat in the caudate lobe. This exerts mass effect on the IVC and hepatic veins which appear patent on noncontrast imaging. This finding could contribute to the patient's jaundice, although no biliary dilatation is evident. Consider further evaluation with MRCP without and with contrast. 3. Cholelithiasis without evidence of cholecystitis or extrahepatic biliary dilatation. 4. Stable prominent retroperitoneal lymph nodes, likely reactive. Electronically Signed   By: Carey Bullocks M.D.   On: September 15, 2019 13:19   DG Chest Portable 1 View  Result Date: 09-15-2019 CLINICAL DATA:  Abdominal pain and diarrhea since receiving COVID vaccination 4 days ago. EXAM: PORTABLE CHEST 1 VIEW COMPARISON:  None. FINDINGS: 1225 hours. The heart size and mediastinal contours are normal. The lungs are clear. There is no pleural effusion or pneumothorax. No acute osseous findings are identified. Telemetry leads overlie the chest. IMPRESSION: No active cardiopulmonary process. Electronically Signed   By: Carey Bullocks M.D.   On: 15-Sep-2019 12:50    EKG: Independently reviewed.  Sinus tachycardia 118 bpm.  Assessment/Plan Active Problems:   Alcoholic hepatitis with ascites    Intractable nausea and vomiting with suspected  acute alcoholic pancreatitis as well as alcoholic hepatitis -Keep n.p.o. except sips with medications/ice chips -As needed IV pain medication with Dilaudid as needed -Bolus another 2 L of IV fluid and maintain on aggressive IV fluid hydration -Zofran for nausea and vomiting -Monitor liver enzymes and lipase levels -Appreciate GI consultation for assistance in management -COVID pending  Acute hepatic encephalopathy related to above -Lactulose enema for now given current symptoms and presentation -Follow ammonia levels  Hypothyroidism -Continue Synthroid -Check TSH  Ongoing alcohol abuse -Monitor carefully on CIWA protocol  Anemia/thrombocytopenia -Secondary to ongoing alcohol abuse -Monitor CBC -Avoid heparin agents -No overt bleeding identified   DVT prophylaxis: SCDs Code Status: Full Family Communication: None at bedside Disposition Plan: Admit for treatment of alcoholic hepatitis/pancreatitis Consults called: GI-Dr. Jena Gauss Admission status: Inpatient/telemetry   Lazer Wollard D Arek Spadafore DO [facility]  If 7PM-7AM, please contact night-coverage [email or web address]  09-15-2019,  2:48 PM

## 2019-09-03 NOTE — Consult Note (Signed)
Referring Provider: Evalee Jefferson, PA-C Primary Care Physician:  Maryruth Hancock, MD Primary Gastroenterologist:  Dr. Gala Romney   Date of Admission: 08/15/2019 Date of Consultation: 08/30/2019  Reason for Consultation:  Elevated LFTs  HPI:  Timothy Houston is a 49 y.o. year old male with history of alcohol abuse, alcohol-induced pancreatitis, presenting with reports of abdominal pain, N/V/D following first Covid vaccine 5 days ago. He is drowsy at time of consultation, providing limited history overall. Majority of information obtained from chart.   Reports stomach is "rolling" but no further diarrhea. Last alcohol intake yesterday. No alcohol today. Drinks about half of a fifth of vodka daily. No obvious changes of pancreatitis on non-contrast imaging. Lipase 101. Bilirubin markedly elevated at 21.1, AST disproportionately elevated compared to ALT. Alk phos 217. Ammonia 85. He is unsure what hospital this is. He knows the year and city. Easily irritated with questions at time of consultation.   Past Medical History:  Diagnosis Date  . Ambulates with cane   . Anxiety   . Back pain   . Neuromuscular disorder ([place])   . Seizures ([place])     Past Surgical History:  Procedure Laterality Date  . NOne      Prior to Admission medications   Medication Sig Start Date End Date Taking? Authorizing Provider  folic acid (FOLVITE) 1 MG tablet Take 1 tablet (1 mg total) by mouth daily. 06/05/19   Corum, Rex Kras, MD  gabapentin (NEURONTIN) 800 MG tablet Take 800 mg by mouth 3 (three) times daily.    [provider]  levothyroxine (SYNTHROID) 50 MCG tablet Take 1 tablet (50 mcg total) by mouth daily. 07/30/19   Corum, Rex Kras, MD  magnesium gluconate (MAGONATE) 30 MG tablet Take 1 tablet (30 mg total) by mouth 2 (two) times daily. 06/05/19   Maryruth Hancock, MD  thiamine 100 MG tablet Take 1 tablet (100 mg total) by mouth daily. 05/22/19   Maryruth Hancock, MD    Current Facility-Administered Medications   Medication Dose Route Frequency Provider Last Rate Last Admin  . 0.9 %  sodium chloride infusion   Intravenous Continuous Manuella Ghazi, Pratik D, DO      . folic acid (FOLVITE) tablet 1 mg  1 mg Oral Daily Manuella Ghazi, Pratik D, DO      . gabapentin (NEURONTIN) capsule 800 mg  800 mg Oral TID Manuella Ghazi, Pratik D, DO      . HYDROmorphone (DILAUDID) injection 0.5 mg  0.5 mg Intravenous Q3H PRN Manuella Ghazi, Pratik D, DO      . lactulose (CHRONULAC) enema 200 gm  300 mL Rectal Once Heath Lark D, DO      . [START ON 09/04/2019] levothyroxine (SYNTHROID) tablet 50 mcg  50 mcg Oral Daily Shah, Pratik D, DO      . LORazepam (ATIVAN) tablet 1-4 mg  1-4 mg Oral Q1H PRN Manuella Ghazi, Pratik D, DO       Or  . LORazepam (ATIVAN) injection 1-4 mg  1-4 mg Intravenous Q1H PRN Manuella Ghazi, Pratik D, DO      . multivitamin with minerals tablet 1 tablet  1 tablet Oral Daily Manuella Ghazi, Pratik D, DO      . ondansetron (ZOFRAN) tablet 4 mg  4 mg Oral Q6H PRN Manuella Ghazi, Pratik D, DO       Or  . ondansetron (ZOFRAN) injection 4 mg  4 mg Intravenous Q6H PRN Manuella Ghazi, Pratik D, DO      . pantoprazole (PROTONIX) injection 40 mg  40 mg Intravenous Q24H Manuella Ghazi, Pratik D, DO      . sodium chloride 0.9 % bolus 2,000 mL  2,000 mL Intravenous Once Manuella Ghazi, Pratik D, DO      . sodium chloride flush (NS) 0.9 % injection 3 mL  3 mL Intravenous Once Manuella Ghazi, Pratik D, DO   Stopped at 08/25/2019 1128  . thiamine (B-1) injection 100 mg  100 mg Intravenous Daily Manuella Ghazi, Pratik D, DO      . thiamine tablet 100 mg  100 mg Oral Daily Manuella Ghazi, Pratik D, DO       Current Outpatient Medications  Medication Sig Dispense Refill  . folic acid (FOLVITE) 1 MG tablet Take 1 tablet (1 mg total) by mouth daily. 30 tablet 1  . gabapentin (NEURONTIN) 800 MG tablet Take 800 mg by mouth 3 (three) times daily.    Marland Kitchen levothyroxine (SYNTHROID) 50 MCG tablet Take 1 tablet (50 mcg total) by mouth daily. 30 tablet 1  . magnesium gluconate (MAGONATE) 30 MG tablet Take 1 tablet (30 mg total) by mouth 2 (two) times daily. 60  tablet 5  . thiamine 100 MG tablet Take 1 tablet (100 mg total) by mouth daily. 30 tablet 1    Allergies as of 08/11/2019  . (No Known Allergies)    Family History  Problem Relation Age of Onset  . Other Mother        died in car accident  . Heart disease Father   . Other Maternal Grandfather        Thinks he had Charcot-Marie-Tooth Disease, along with maternal uncles.    Social History   Socioeconomic History  . Marital status: Divorced    Spouse name: Not on file  . Number of children: 2  . Years of education: 70  . Highest education level: High school graduate  Occupational History  . Occupation: disabled  Tobacco Use  . Smoking status: Current Every Day Smoker    Packs/day: 0.50    Types: Cigarettes  . Smokeless tobacco: Never Used  Substance and Sexual Activity  . Alcohol use: Yes    Comment: estimates 4-5 beers daily  . Drug use: Yes    Types: Marijuana    Comment: uses nightly  . Sexual activity: Not on file  Other Topics Concern  . Not on file  Social History Narrative   Lives at home with his roommate.   Right-handed.   No daily caffeine use.   Social Determinants of Health   Financial Resource Strain:   . Difficulty of Paying Living Expenses:   Food Insecurity:   . Worried About Charity fundraiser in the Last Year:   . Arboriculturist in the Last Year:   Transportation Needs:   . Film/video editor (Medical):   Marland Kitchen Lack of Transportation (Non-Medical):   Physical Activity:   . Days of Exercise per Week:   . Minutes of Exercise per Session:   Stress:   . Feeling of Stress :   Social Connections:   . Frequency of Communication with Friends and Family:   . Frequency of Social Gatherings with Friends and Family:   . Attends Religious Services:   . Active Member of Clubs or Organizations:   . Attends Archivist Meetings:   Marland Kitchen Marital Status:   Intimate Partner Violence:   . Fear of Current or Ex-Partner:   . Emotionally Abused:    Marland Kitchen Physically Abused:   . Sexually Abused:  Review of Systems: Limited due to mental status  Physical Exam: Vital signs in last 24 hours: Temp:  [98.4 F (36.9 C)] 98.4 F (36.9 C) (03/29 1105) Pulse Rate:  [113-125] 113 (03/29 1345) Resp:  [20-21] 21 (03/29 1345) BP: (110)/(73) 110/73 (03/29 1105) SpO2:  [92 %-95 %] 92 % (03/29 1345) Weight:  [88.9 kg] 88.9 kg (03/29 1106)   General:   Drowsy but easily awakens. Chronically ill-appearing. Severely jaundiced Head:  Normocephalic and atraumatic. Eyes:  +scleral icterus  Ears:  Normal auditory acuity. Nose:  No deformity, discharge,  or lesions. Mouth:  Mask in place  Lungs:  Clear throughout to auscultation.    Heart:  S1 S2 present with mild tachycardia, heart rate in 1teens Abdomen:  +BS, TTP lower abdomen, round but not tense, +anasarca Rectal:  Deferred    Msk:  Symmetrical without gross deformities. Normal posture. Extremities:  With lower extremity pre-tibial edema Neurologic:  Oriented to person, year, situation. Unknown place. No asterixis  Psych:  Flat affect  Intake/Output from previous day: No intake/output data recorded. Intake/Output this shift: No intake/output data recorded.  Lab Results: Recent Labs    08/08/2019 1138  WBC 11.6*  HGB 10.7*  HCT 29.9*  PLT 82*   BMET Recent Labs    09/02/2019 1137  NA 133*  K 3.8  CL 87*  CO2 27  GLUCOSE 113*  BUN 13  CREATININE <0.30*  CALCIUM 10.4*   LFT Recent Labs    08/22/2019 1137  PROT 7.0  ALBUMIN 1.9*  AST 325*  ALT 126*  ALKPHOS 217*  BILITOT 21.1*   PT/INR Recent Labs    08/26/2019 1315  LABPROT 22.7*  INR 2.0*    Studies/Results: CT ABDOMEN PELVIS WO CONTRAST  Result Date: 09/04/2019 CLINICAL DATA:  Abdominal pain with diarrhea and jaundice since COVID-19 vaccination 4 days ago. EXAM: CT ABDOMEN AND PELVIS WITHOUT CONTRAST TECHNIQUE: Multidetector CT imaging of the abdomen and pelvis was performed following the standard protocol  without IV contrast. COMPARISON:  Abdominopelvic CT 03/02/2019. FINDINGS: Lower chest: Stable band like density in the right lower lobe, likely scarring or chronic atelectasis. The lung bases are otherwise clear. No significant pleural or pericardial effusion. Hepatobiliary: Diffuse hepatic steatosis is again noted, and this now appears more heterogeneous in distribution. There is apparent focal fat centrally in the liver, extending into the caudate lobe and measuring up to 5.1 x 3.4 cm transverse on image 29/3. This extends up to 8.7 cm in length on coronal image 74/6 and exerts mass effect on the IVC and hepatic veins which appear patent. Multiple gallstones are again noted. The gallbladder is distended without wall thickening or surrounding inflammation. No apparent intrahepatic or extrahepatic biliary dilatation. Pancreas: Unremarkable. No pancreatic ductal dilatation or surrounding inflammatory changes. Spleen: Normal in size without focal abnormality. Adrenals/Urinary Tract: Both adrenal glands appear normal. The kidneys appear unremarkable as imaged in the noncontrast state. No evidence of urinary tract calculus, hydronephrosis or asymmetric perinephric soft tissue stranding. Mild bladder wall thickening, likely related to incomplete distension. Stomach/Bowel: No evidence of bowel wall thickening, distention or surrounding inflammatory change. The appendix is not clearly seen, but there is no focal pericecal inflammation. Vascular/Lymphatic: Multiple prominent retroperitoneal lymph nodes are similar to the previous study and likely reactive. As above, focal fat within the caudate lobe exerts mass effect on the IVC and hepatic veins. No evidence for vascular occlusion on noncontrast imaging. Reproductive: The prostate gland and seminal vesicles appear normal. Other: New small  amount of low-density ascites in both pericolic gutters and the pelvis. Mild generalized edema throughout the subcutaneous and  intra-abdominal fat. No focal extraluminal fluid or air collections identified. Musculoskeletal: No acute or significant osseous findings. Stable Schmorl's node involving the superior endplate of V29. IMPRESSION: 1. New small amount of low-density ascites and generalized edema throughout the subcutaneous and intra-abdominal fat consistent with anasarca. No focal extraluminal fluid collections. 2. Diffuse hepatic steatosis with focal fat in the caudate lobe. This exerts mass effect on the IVC and hepatic veins which appear patent on noncontrast imaging. This finding could contribute to the patient's jaundice, although no biliary dilatation is evident. Consider further evaluation with MRCP without and with contrast. 3. Cholelithiasis without evidence of cholecystitis or extrahepatic biliary dilatation. 4. Stable prominent retroperitoneal lymph nodes, likely reactive. Electronically Signed   By: Richardean Sale M.D.   On: 09/01/2019 13:19   DG Chest Portable 1 View  Result Date: 08/09/2019 CLINICAL DATA:  Abdominal pain and diarrhea since receiving COVID vaccination 4 days ago. EXAM: PORTABLE CHEST 1 VIEW COMPARISON:  None. FINDINGS: 1225 hours. The heart size and mediastinal contours are normal. The lungs are clear. There is no pleural effusion or pneumothorax. No acute osseous findings are identified. Telemetry leads overlie the chest. IMPRESSION: No active cardiopulmonary process. Electronically Signed   By: Richardean Sale M.D.   On: 08/22/2019 12:50     Impression: 49 year old male with history of alcohol abuse, ETOH pancreatitis in Sept 2020, presenting with abdominal pain, nausea, vomiting, diarrhea, and found to be severely jaundiced with bilirubin 21. INR elevated to 2.0. Findings consistent with alcoholic hepatitis, and discriminant function markedly elevated at 65.7 using average PT level. Although lipase mildly elevated at 100, no obvious changes of pancreatitis on non-contrast CT. Abdominal pain  likely more related to anasarca. Very small amount of ascites.   Will need prednisolone therapy but await acute hepatitis panel, which I suspect will be negative. If abdominal pain worsens, complete CT with contrast. Will hold off and provide supportive measures for now. Ammonia elevated at 85 on admission, but he is able to respond to questions appropriately and only notes confusion regarding hospital name. No asterixis on exam.   Plan: Repeat HFP, INR, CBC, ammonia in morning Await hepatitis panel Monitor for mental status changes: agree with lactulose X 1 already ordered Continue IVF support Start prednisolone after hepatitis panel is resulted barring any other contraindication PPI daily CT abd/pelvis with contrast if worsening abdominal pain Will reassess in the morning  Annitta Needs, PhD, ANP-BC [facility]      LOS: 0 days    08/27/2019, 3:34 PM

## 2019-09-03 NOTE — TOC Initial Note (Signed)
Transition of Care [facility]) - Initial/Assessment Note    Patient Details  Name: Timothy Houston MRN: [id number] Date of Birth: [birth year]  [facility] CM/SW Contact:    Sherie Don, LCSW Phone Number: 08/12/2019, 6:22 PM  Clinical Narrative: Received consult for substance use resources. Met with patient after he was admitted to the hospital and discussed outpatient resources. Provided patient with copy of outpatient clinics that accept Medicaid.           Expected Discharge Plan: Home/Self Care Barriers to Discharge: Continued Medical Work up   Patient Goals and CMS Choice     Choice offered to / list presented to : Patient  Expected Discharge Plan and Services Expected Discharge Plan: Home/Self Care     Post Acute Care Choice: NA     Prior Living Arrangements/Services   Patient language and need for interpreter reviewed:: Yes Do you feel safe going back to the place where you live?: Yes      Need for Family Participation in Patient Care: No (Comment) Care giver support system in place?: Yes (comment)   Criminal Activity/Legal Involvement Pertinent to Current Situation/Hospitalization: No - Comment as needed  Activities of Daily Living Home Assistive Devices/Equipment: None ADL Screening (condition at time of admission) Patient's cognitive ability adequate to safely complete daily activities?: Yes Is the patient deaf or have difficulty hearing?: No Does the patient have difficulty seeing, even when wearing glasses/contacts?: No Does the patient have difficulty concentrating, remembering, or making decisions?: No Patient able to express need for assistance with ADLs?: Yes Does the patient have difficulty dressing or bathing?: Yes Independently performs ADLs?: No Communication: Independent Dressing (OT): Needs assistance Is this a change from baseline?: Change from baseline, expected to last <3days Grooming: Needs assistance Is this a change from baseline?:  Change from baseline, expected to last <3 days Feeding: Needs assistance Is this a change from baseline?: Change from baseline, expected to last <3 days Bathing: Needs assistance Is this a change from baseline?: Change from baseline, expected to last <3 days Toileting: Needs assistance Is this a change from baseline?: Change from baseline, expected to last <3 days In/Out Bed: Needs assistance Is this a change from baseline?: Change from baseline, expected to last <3 days Walks in Home: Needs assistance Is this a change from baseline?: Change from baseline, expected to last <3 days Does the patient have difficulty walking or climbing stairs?: Yes Weakness of Legs: Both Weakness of Arms/Hands: None  Permission Sought/Granted     Emotional Assessment Appearance:: Appears stated age Attitude/Demeanor/Rapport: Engaged Affect (typically observed): Accepting Orientation: : Oriented to Self, Oriented to Place, Oriented to  Time, Oriented to Situation Alcohol / Substance Use: Alcohol Use    Admission diagnosis:  Jaundice [R17] Calculus of gallbladder without cholecystitis without obstruction [K27.06] Alcoholic hepatitis with ascites [K70.11] Muscle spasms of lower extremity, unspecified laterality [M62.838] Chronic midline low back pain without sciatica [M54.5, G89.29] Patient Active Problem List   Diagnosis Date Noted  . Alcoholic hepatitis with ascites 08/27/2019  . Other abnormalities of gait and mobility 07/24/2019  . Seizure-like activity ([place]) 07/24/2019  . Hypomagnesemia 06/07/2019  . Seizures ([place]) 05/22/2019  . Chronic midline low back pain without sciatica 05/22/2019  . Muscle spasms of lower extremity 05/22/2019  . Pancreatitis 03/02/2019  . Alcohol abuse 03/02/2019  . Type 2 diabetes mellitus without complication, without long-term current use of insulin ([place]) 03/02/2019  . Lactic acidosis 03/02/2019  . Acute hypokalemia 03/02/2019   PCP:  Corum, Rex Kras, MD Pharmacy:    [place], [place] - [place] [address] [place] [address] Phone: [phone] Fax: [phone]   Readmission Risk Interventions No flowsheet data found.

## 2019-09-03 NOTE — ED Provider Notes (Addendum)
[facility] EMERGENCY DEPARTMENT Provider Note   CSN: [id number] Arrival date & time: 08/16/2019  1044     History Chief Complaint  Patient presents with   Abdominal Pain    Timothy Houston is a 49 y.o. male with a history as outlined below, most significant for alcohol abuse, type 2 diabetes, seizure disorder who had his first COVID-19 vaccine 5 days ago, at which time he started to develop abdominal pain, nausea with vomiting, jaundice, diarrhea which has resolved, and generalized myalgias along with abdominal distention.  He denies fevers or chills.  He also endorses shortness of breath with cough, productive of a clear sputum.  He reports drinking one half of 1/5 of liquor daily.  He denies history of liver problems, but has had acute pancreatitis.  He denies chest pain, headache or dizziness.  At baseline, he requires a cane for ambulation secondary to a neuromuscular disorder which is currently being evaluated by neurology.  He has had no treatment prior to arrival.  Patient denies excessive use of Tylenol.   HPI     Past Medical History:  Diagnosis Date   Ambulates with cane    Anxiety    Back pain    Neuromuscular disorder (HCC)    Seizures (HCC)     Patient Active Problem List   Diagnosis Date Noted   Other abnormalities of gait and mobility 07/24/2019   Seizure-like activity (HCC) 07/24/2019   Hypomagnesemia 06/07/2019   Seizures (HCC) 05/22/2019   Chronic midline low back pain without sciatica 05/22/2019   Muscle spasms of lower extremity 05/22/2019   Pancreatitis 03/02/2019   Alcohol abuse 03/02/2019   Type 2 diabetes mellitus without complication, without long-term current use of insulin (HCC) 03/02/2019   Lactic acidosis 03/02/2019   Acute hypokalemia 03/02/2019    History reviewed. No pertinent surgical history.     Family History  Problem Relation Age of Onset   Other Mother        died in car accident   Heart disease Father     Other Maternal Grandfather        Thinks he had Charcot-Marie-Tooth Disease, along with maternal uncles.    Social History   Tobacco Use   Smoking status: Current Every Day Smoker    Packs/day: 0.50    Types: Cigarettes   Smokeless tobacco: Never Used  Substance Use Topics   Alcohol use: Yes    Comment: estimates 4-5 beers daily   Drug use: Yes    Types: Marijuana    Comment: uses nightly    Home Medications Prior to Admission medications   Medication Sig Start Date End Date Taking? Authorizing Provider  folic acid (FOLVITE) 1 MG tablet Take 1 tablet (1 mg total) by mouth daily. 06/05/19   Corum, Minerva Fester, MD  gabapentin (NEURONTIN) 800 MG tablet Take 800 mg by mouth 3 (three) times daily.    [provider]  levothyroxine (SYNTHROID) 50 MCG tablet Take 1 tablet (50 mcg total) by mouth daily. 07/30/19   Corum, Minerva Fester, MD  magnesium gluconate (MAGONATE) 30 MG tablet Take 1 tablet (30 mg total) by mouth 2 (two) times daily. 06/05/19   Wandra Feinstein, MD  thiamine 100 MG tablet Take 1 tablet (100 mg total) by mouth daily. 05/22/19   Corum, Minerva Fester, MD    Allergies    Patient has no known allergies.  Review of Systems   Review of Systems  Constitutional: Positive for fatigue. Negative for chills and  fever.  HENT: Negative for congestion.   Eyes: Negative.   Respiratory: Positive for shortness of breath. Negative for chest tightness.   Cardiovascular: Negative for chest pain.  Gastrointestinal: Positive for abdominal distention, abdominal pain, diarrhea, nausea and vomiting.  Genitourinary: Negative.  Negative for decreased urine volume and dysuria.  Musculoskeletal: Positive for myalgias. Negative for arthralgias, joint swelling and neck pain.  Skin: Positive for color change. Negative for rash and wound.  Neurological: Positive for weakness. Negative for dizziness, light-headedness, numbness and headaches.  Psychiatric/Behavioral: Negative.     Physical  Exam Updated Vital Signs BP 110/73 (BP Location: Left Arm)    Pulse (!) 113    Temp 98.4 F (36.9 C) (Oral)    Resp (!) 21    Ht 5\' 8"  (1.727 m)    Wt 88.9 kg    SpO2 92%    BMI 29.80 kg/m   Physical Exam Vitals and nursing note reviewed.  Constitutional:      General: He is not in acute distress.    Appearance: He is ill-appearing.  HENT:     Head: Normocephalic and atraumatic.  Eyes:     General: Scleral icterus present.     Conjunctiva/sclera: Conjunctivae normal.     Comments: Strongly jaundiced  Cardiovascular:     Rate and Rhythm: Regular rhythm. Tachycardia present.     Heart sounds: Normal heart sounds.  Pulmonary:     Effort: Pulmonary effort is normal. No respiratory distress.     Breath sounds: Normal breath sounds. No wheezing.  Abdominal:     General: Bowel sounds are normal. There is distension.     Palpations: Abdomen is soft. There is hepatomegaly.     Tenderness: There is generalized abdominal tenderness.     Comments: Increased tympany to percussion throughout all quadrants.  Flank dullness, no appreciable fluid wave.  Musculoskeletal:        General: Normal range of motion.     Cervical back: Normal range of motion.  Skin:    General: Skin is warm and dry.     Capillary Refill: Capillary refill takes 2 to 3 seconds.     Coloration: Skin is jaundiced.  Neurological:     Mental Status: He is alert and oriented to person, place, and time.     ED Results / Procedures / Treatments   Labs (all labs ordered are listed, but only abnormal results are displayed) Labs Reviewed  LIPASE, BLOOD - Abnormal; Notable for the following components:      Result Value   Lipase 101 (*)    All other components within normal limits  COMPREHENSIVE METABOLIC PANEL - Abnormal; Notable for the following components:   Sodium 133 (*)    Chloride 87 (*)    Glucose, Bld 113 (*)    Creatinine, Ser <0.30 (*)    Calcium 10.4 (*)    Albumin 1.9 (*)    AST 325 (*)    ALT 126 (*)     Alkaline Phosphatase 217 (*)    Total Bilirubin 21.1 (*)    Anion gap 19 (*)    All other components within normal limits  CBC WITH DIFFERENTIAL/PLATELET - Abnormal; Notable for the following components:   WBC 11.6 (*)    RBC 3.07 (*)    Hemoglobin 10.7 (*)    HCT 29.9 (*)    MCH 34.9 (*)    RDW 16.0 (*)    Platelets 82 (*)    Neutro Abs 9.4 (*)  Monocytes Absolute 1.2 (*)    Abs Immature Granulocytes 0.15 (*)    All other components within normal limits  AMMONIA - Abnormal; Notable for the following components:   Ammonia 85 (*)    All other components within normal limits  ACETAMINOPHEN LEVEL - Abnormal; Notable for the following components:   Acetaminophen (Tylenol), Serum <10 (*)    All other components within normal limits  ETHANOL - Abnormal; Notable for the following components:   Alcohol, Ethyl (B) 89 (*)    All other components within normal limits  PROTIME-INR - Abnormal; Notable for the following components:   Prothrombin Time 22.7 (*)    INR 2.0 (*)    All other components within normal limits  URINALYSIS, ROUTINE W REFLEX MICROSCOPIC  HEPATITIS PANEL, ACUTE  POC SARS CORONAVIRUS 2 AG -  ED    EKG None  Radiology CT ABDOMEN PELVIS WO CONTRAST  Result Date: 08/10/2019 CLINICAL DATA:  Abdominal pain with diarrhea and jaundice since COVID-19 vaccination 4 days ago. EXAM: CT ABDOMEN AND PELVIS WITHOUT CONTRAST TECHNIQUE: Multidetector CT imaging of the abdomen and pelvis was performed following the standard protocol without IV contrast. COMPARISON:  Abdominopelvic CT 03/02/2019. FINDINGS: Lower chest: Stable band like density in the right lower lobe, likely scarring or chronic atelectasis. The lung bases are otherwise clear. No significant pleural or pericardial effusion. Hepatobiliary: Diffuse hepatic steatosis is again noted, and this now appears more heterogeneous in distribution. There is apparent focal fat centrally in the liver, extending into the caudate  lobe and measuring up to 5.1 x 3.4 cm transverse on image 29/3. This extends up to 8.7 cm in length on coronal image 74/6 and exerts mass effect on the IVC and hepatic veins which appear patent. Multiple gallstones are again noted. The gallbladder is distended without wall thickening or surrounding inflammation. No apparent intrahepatic or extrahepatic biliary dilatation. Pancreas: Unremarkable. No pancreatic ductal dilatation or surrounding inflammatory changes. Spleen: Normal in size without focal abnormality. Adrenals/Urinary Tract: Both adrenal glands appear normal. The kidneys appear unremarkable as imaged in the noncontrast state. No evidence of urinary tract calculus, hydronephrosis or asymmetric perinephric soft tissue stranding. Mild bladder wall thickening, likely related to incomplete distension. Stomach/Bowel: No evidence of bowel wall thickening, distention or surrounding inflammatory change. The appendix is not clearly seen, but there is no focal pericecal inflammation. Vascular/Lymphatic: Multiple prominent retroperitoneal lymph nodes are similar to the previous study and likely reactive. As above, focal fat within the caudate lobe exerts mass effect on the IVC and hepatic veins. No evidence for vascular occlusion on noncontrast imaging. Reproductive: The prostate gland and seminal vesicles appear normal. Other: New small amount of low-density ascites in both pericolic gutters and the pelvis. Mild generalized edema throughout the subcutaneous and intra-abdominal fat. No focal extraluminal fluid or air collections identified. Musculoskeletal: No acute or significant osseous findings. Stable Schmorl's node involving the superior endplate of T11. IMPRESSION: 1. New small amount of low-density ascites and generalized edema throughout the subcutaneous and intra-abdominal fat consistent with anasarca. No focal extraluminal fluid collections. 2. Diffuse hepatic steatosis with focal fat in the caudate lobe.  This exerts mass effect on the IVC and hepatic veins which appear patent on noncontrast imaging. This finding could contribute to the patient's jaundice, although no biliary dilatation is evident. Consider further evaluation with MRCP without and with contrast. 3. Cholelithiasis without evidence of cholecystitis or extrahepatic biliary dilatation. 4. Stable prominent retroperitoneal lymph nodes, likely reactive. Electronically Signed   By: Chrissie Noa  Purcell Mouton M.D.   On: 09/01/2019 13:19   DG Chest Portable 1 View  Result Date: 08/18/2019 CLINICAL DATA:  Abdominal pain and diarrhea since receiving COVID vaccination 4 days ago. EXAM: PORTABLE CHEST 1 VIEW COMPARISON:  None. FINDINGS: 1225 hours. The heart size and mediastinal contours are normal. The lungs are clear. There is no pleural effusion or pneumothorax. No acute osseous findings are identified. Telemetry leads overlie the chest. IMPRESSION: No active cardiopulmonary process. Electronically Signed   By: Carey Bullocks M.D.   On: 08/26/2019 12:50    Procedures .Critical Care Performed by: Burgess Amor, PA-C Authorized by: Burgess Amor, PA-C   Critical care provider statement:    Critical care time (minutes):  40   Critical care time was exclusive of:  Separately billable procedures and treating other patients   Critical care was necessary to treat or prevent imminent or life-threatening deterioration of the following conditions:  Hepatic failure and dehydration   Critical care was time spent personally by me on the following activities:  Discussions with consultants, examination of patient, review of old charts, re-evaluation of patient's condition, ordering and review of radiographic studies, ordering and review of laboratory studies and development of treatment plan with patient or surrogate   (including critical care time)     Medications Ordered in ED Medications  sodium chloride flush (NS) 0.9 % injection 3 mL (0 mLs Intravenous Hold  08/28/2019 1128)  ondansetron (ZOFRAN) injection 4 mg (4 mg Intravenous Given 08/13/2019 1221)  morphine 4 MG/ML injection 4 mg (4 mg Intravenous Given 08/09/2019 1222)  sodium chloride 0.9 % bolus 500 mL (500 mLs Intravenous New Bag/Given 08/18/2019 1222)    ED Course  I have reviewed the triage vital signs and the nursing notes.  Pertinent labs & imaging results that were available during my care of the patient were reviewed by me and considered in my medical decision making (see chart for details).    MDM Rules/Calculators/A&P                      Pt's labs and imaging reviewed, discussed with pt who will require admission.  Significant elevation in LFT's along with profound bilirubin level, coagulopathy with INR of 2.0, also with reduced albumin levels.  CT imaging also reviewed.  Discussed labs and CT findings with Dr Jena Gauss of GI who will consult pt with hospitalist admission.  He does not need MR imaging at this time given normal bile duct without dilation, work up c/w alcoholic hepatitis with low suspicion for gallbladder disease.    Discussed with Dr. Sherryll Burger who accepts pt for admission.   Final Clinical Impression(s) / ED Diagnoses Final diagnoses:  Alcoholic hepatitis with ascites  Jaundice  Calculus of gallbladder without cholecystitis without obstruction    Rx / DC Orders ED Discharge Orders    None       Victoriano Lain 08/12/2019 1452    Burgess Amor, PA-C 08/29/2019 1500    Long, Arlyss Repress, MD 08/25/2019 1515

## 2019-09-04 ENCOUNTER — Encounter (HOSPITAL_COMMUNITY): Payer: Self-pay | Admitting: Internal Medicine

## 2019-09-04 DIAGNOSIS — K7682 Hepatic encephalopathy: Secondary | ICD-10-CM

## 2019-09-04 DIAGNOSIS — K922 Gastrointestinal hemorrhage, unspecified: Secondary | ICD-10-CM

## 2019-09-04 DIAGNOSIS — K7011 Alcoholic hepatitis with ascites: Principal | ICD-10-CM

## 2019-09-04 DIAGNOSIS — K7200 Acute and subacute hepatic failure without coma: Secondary | ICD-10-CM

## 2019-09-04 DIAGNOSIS — N179 Acute kidney failure, unspecified: Secondary | ICD-10-CM

## 2019-09-04 DIAGNOSIS — K7290 Hepatic failure, unspecified without coma: Secondary | ICD-10-CM

## 2019-09-04 LAB — COMPREHENSIVE METABOLIC PANEL
ALT: 109 U/L — ABNORMAL HIGH (ref 0–44)
AST: 276 U/L — ABNORMAL HIGH (ref 15–41)
Albumin: 1.6 g/dL — ABNORMAL LOW (ref 3.5–5.0)
Alkaline Phosphatase: 183 U/L — ABNORMAL HIGH (ref 38–126)
Anion gap: 15 (ref 5–15)
BUN: 17 mg/dL (ref 6–20)
CO2: 24 mmol/L (ref 22–32)
Calcium: 9.3 mg/dL (ref 8.9–10.3)
Chloride: 96 mmol/L — ABNORMAL LOW (ref 98–111)
Creatinine, Ser: 1.26 mg/dL — ABNORMAL HIGH (ref 0.61–1.24)
GFR calc Af Amer: >60 mL/min (ref >60)
GFR calc non Af Amer: >60 mL/min (ref >60)
Glucose, Bld: 65 mg/dL — ABNORMAL LOW (ref 70–99)
Potassium: 3.8 mmol/L (ref 3.5–5.1)
Sodium: 135 mmol/L (ref 135–145)
Total Bilirubin: 19.2 mg/dL (ref 0.3–1.2)
Total Protein: 6.2 g/dL — ABNORMAL LOW (ref 6.5–8.1)

## 2019-09-04 LAB — CBC
HCT: 27.4 % — ABNORMAL LOW (ref 39.0–52.0)
Hemoglobin: 9.6 g/dL — ABNORMAL LOW (ref 13.0–17.0)
MCH: 34.7 pg — ABNORMAL HIGH (ref 26.0–34.0)
MCHC: 35 g/dL (ref 30.0–36.0)
MCV: 98.9 fL (ref 80.0–100.0)
Platelets: 74 10*3/uL — ABNORMAL LOW (ref 150–400)
RBC: 2.77 MIL/uL — ABNORMAL LOW (ref 4.22–5.81)
RDW: 16.7 % — ABNORMAL HIGH (ref 11.5–15.5)
WBC: 10.9 10*3/uL — ABNORMAL HIGH (ref 4.0–10.5)
nRBC: 0.3 % — ABNORMAL HIGH (ref 0.0–0.2)

## 2019-09-04 LAB — CBC WITH DIFFERENTIAL/PLATELET
Abs Immature Granulocytes: 0.08 10*3/uL — ABNORMAL HIGH (ref 0.00–0.07)
Basophils Absolute: 0 10*3/uL (ref 0.0–0.1)
Basophils Relative: 0 %
Eosinophils Absolute: 0 10*3/uL (ref 0.0–0.5)
Eosinophils Relative: 0 %
HCT: 26.1 % — ABNORMAL LOW (ref 39.0–52.0)
Hemoglobin: 8.7 g/dL — ABNORMAL LOW (ref 13.0–17.0)
Immature Granulocytes: 1 %
Lymphocytes Relative: 9 %
Lymphs Abs: 0.6 10*3/uL — ABNORMAL LOW (ref 0.7–4.0)
MCH: 35.4 pg — ABNORMAL HIGH (ref 26.0–34.0)
MCHC: 33.3 g/dL (ref 30.0–36.0)
MCV: 106.1 fL — ABNORMAL HIGH (ref 80.0–100.0)
Monocytes Absolute: 0.5 10*3/uL (ref 0.1–1.0)
Monocytes Relative: 8 %
Neutro Abs: 5.7 10*3/uL (ref 1.7–7.7)
Neutrophils Relative %: 82 %
Platelets: 65 10*3/uL — ABNORMAL LOW (ref 150–400)
RBC: 2.46 MIL/uL — ABNORMAL LOW (ref 4.22–5.81)
RDW: 18 % — ABNORMAL HIGH (ref 11.5–15.5)
WBC: 6.9 10*3/uL (ref 4.0–10.5)
nRBC: 1.2 % — ABNORMAL HIGH (ref 0.0–0.2)

## 2019-09-04 LAB — GLUCOSE, CAPILLARY
Glucose-Capillary: 50 mg/dL — ABNORMAL LOW (ref 70–99)
Glucose-Capillary: 70 mg/dL (ref 70–99)
Glucose-Capillary: 121 mg/dL — ABNORMAL HIGH (ref 70–99)
Glucose-Capillary: 68 mg/dL — ABNORMAL LOW (ref 70–99)

## 2019-09-04 LAB — PROTIME-INR
INR: 2.2 — ABNORMAL HIGH (ref 0.8–1.2)
Prothrombin Time: 24.6 seconds — ABNORMAL HIGH (ref 11.4–15.2)

## 2019-09-04 LAB — AMMONIA: Ammonia: 149 umol/L — ABNORMAL HIGH (ref 9–35)

## 2019-09-04 LAB — HEMOGLOBIN AND HEMATOCRIT, BLOOD
HCT: 26.9 % — ABNORMAL LOW (ref 39.0–52.0)
Hemoglobin: 8.9 g/dL — ABNORMAL LOW (ref 13.0–17.0)

## 2019-09-04 LAB — HEMOGLOBIN A1C
Hgb A1c MFr Bld: 4.6 % — ABNORMAL LOW (ref 4.8–5.6)
Mean Plasma Glucose: 85.32 mg/dL

## 2019-09-04 LAB — LIPASE, BLOOD: Lipase: 56 U/L — ABNORMAL HIGH (ref 11–51)

## 2019-09-04 LAB — MAGNESIUM: Magnesium: 1.1 mg/dL — ABNORMAL LOW (ref 1.7–2.4)

## 2019-09-04 MED ORDER — CHLORHEXIDINE GLUCONATE CLOTH 2 % EX PADS
6.0000 | MEDICATED_PAD | Freq: Every day | CUTANEOUS | Status: DC
  Administered 2019-09-04 – 2019-09-05 (×2): 6 via TOPICAL

## 2019-09-04 MED ORDER — LACTULOSE 10 GM/15ML PO SOLN: 30.0000 g | Freq: Every day | ORAL | Status: DC

## 2019-09-04 MED ORDER — DEXTROSE-NACL 5-0.9 % IV SOLN: INTRAVENOUS | Status: DC

## 2019-09-04 MED ORDER — DEXTROSE 50 % IV SOLN
12.5000 g | INTRAVENOUS | Status: AC
  Administered 2019-09-04: 12.5 g via INTRAVENOUS
  Filled 2019-09-04: qty 50

## 2019-09-04 MED ORDER — METHYLPREDNISOLONE SODIUM SUCC 40 MG IJ SOLR
32.0000 mg | Freq: Every day | INTRAMUSCULAR | Status: DC
  Administered 2019-09-04 – 2019-09-05 (×2): 32 mg via INTRAVENOUS
  Filled 2019-09-04 (×2): qty 1

## 2019-09-04 MED ORDER — LEVOTHYROXINE SODIUM 100 MCG/5ML IV SOLN
25.0000 ug | Freq: Every day | INTRAVENOUS | Status: DC
  Administered 2019-09-05: 25 ug via INTRAVENOUS
  Filled 2019-09-04: qty 5

## 2019-09-04 MED ORDER — LACTULOSE 10 GM/15ML PO SOLN
20.0000 g | Freq: Four times a day (QID) | ORAL | Status: DC
  Administered 2019-09-04 – 2019-09-05 (×3): 20 g
  Filled 2019-09-04 (×4): qty 30

## 2019-09-04 MED ORDER — SODIUM CHLORIDE 0.9 % IV SOLN
8.0000 mg/h | INTRAVENOUS | Status: DC
  Administered 2019-09-04 – 2019-09-05 (×3): 8 mg/h via INTRAVENOUS
  Filled 2019-09-04 (×6): qty 80

## 2019-09-04 MED ORDER — SODIUM CHLORIDE 0.9 % IV SOLN
80.0000 mg | Freq: Once | INTRAVENOUS | Status: AC
  Administered 2019-09-04: 80 mg via INTRAVENOUS
  Filled 2019-09-04: qty 80

## 2019-09-04 MED ORDER — MAGNESIUM SULFATE 2 GM/50ML IV SOLN
2.0000 g | Freq: Once | INTRAVENOUS | Status: AC
  Administered 2019-09-04: 2 g via INTRAVENOUS
  Filled 2019-09-04: qty 50

## 2019-09-04 MED ORDER — VITAMIN K1 10 MG/ML IJ SOLN
10.0000 mg | Freq: Every day | INTRAVENOUS | Status: DC
  Administered 2019-09-04 – 2019-09-05 (×2): 10 mg via INTRAVENOUS
  Filled 2019-09-04 (×3): qty 1

## 2019-09-04 MED ORDER — SODIUM CHLORIDE 0.9 % IV BOLUS
1000.0000 mL | Freq: Once | INTRAVENOUS | Status: AC
  Administered 2019-09-04: 1000 mL via INTRAVENOUS

## 2019-09-04 MED ORDER — LACTULOSE ENEMA
300.0000 mL | Freq: Three times a day (TID) | ORAL | Status: DC
  Administered 2019-09-04 (×2): 300 mL via RECTAL
  Filled 2019-09-04 (×9): qty 300

## 2019-09-04 MED ORDER — PANTOPRAZOLE SODIUM 40 MG IV SOLR: 40.0000 mg | Freq: Two times a day (BID) | INTRAVENOUS | Status: DC

## 2019-09-04 NOTE — Progress Notes (Signed)
Subjective: Patient is moaning when I enter the room. Minimal response when asking questions. When asking if he is hurting he says "yeah." Asked if he had abdominal pain and he said "yeah." When asking if he feels short of breath he says "yeah." Otherwise, not communicating or answering other questions. No overt GI bleeding per nursing staff.   Spoke with Dr. Manuella Ghazi who was concerned about acute mental status change.  He plan to transfer him to ICU and place NG tube for lactulose.  Objective: Vital signs in last 24 hours: Temp:  [97.5 F (36.4 C)-98.6 F (37 C)] 98.6 F (37 C) (03/30 0800) Pulse Rate:  [92-127] 92 (03/30 0800) Resp:  [18-21] 18 (03/30 0504) BP: (97-132)/(66-91) 119/66 (03/30 0800) SpO2:  [92 %-100 %] 94 % (03/30 0800) Weight:  [88.9 kg-90.5 kg] 90.5 kg (03/29 1634) Last BM Date: 08/08/2019 General:   Decreased alert, moaning, minimal communication, jaundiced, some increase in respiratory rate.  Head:  Normocephalic and atraumatic. Eyes:  Scleral icterus present. Mouth:  Without lesions. Oral mucosa is dry.  Heart:  S1, S2 present, no murmurs noted.  Abdomen:  Bowel sounds present. Abdomen is distended, tympanic, and patient seems to have mild generalized tenderness to palpation. No rebound or guarding. No masses appreciated. Spoke with Dr. Manuella Ghazi who states there is no change in his abdomen from yesterday to today.  Extremities:  2+ LE pitting edema up to his knees.  Neurologic: Decreased responsiveness.   Skin:  Warm and dry, intact without significant lesions.  Psych:  Unable to assess  Intake/Output from previous day: 03/29 0701 - 03/30 0700 In: 1963.3 [I.V.:1421; IV Piggyback:542.3] Out: 150 [Urine:150] Intake/Output this shift: No intake/output data recorded.  Lab Results: Recent Labs    09/02/2019 1138 08/17/2019 1615 09/04/19 0422  WBC 11.6* 10.4 10.9*  HGB 10.7* 10.2* 9.6*  HCT 29.9* 28.9* 27.4*  PLT 82* 80* 74*   BMET Recent Labs    08/23/2019 1137  08/24/2019 1615 09/04/19 0422  NA 133* 129* 135  K 3.8 2.8* 3.8  CL 87* 86* 96*  CO2 27 27 24   GLUCOSE 113* 110* 65*  BUN 13 14 17   CREATININE <0.30* 0.72 1.26*  CALCIUM 10.4* 10.0 9.3   LFT Recent Labs    09/02/2019 1137 08/23/2019 1615 09/04/19 0422  PROT 7.0 6.8 6.2*  ALBUMIN 1.9* 1.8* 1.6*  AST 325* 307* 276*  ALT 126* 119* 109*  ALKPHOS 217* 212* 183*  BILITOT 21.1* 20.9* 19.2*   PT/INR Recent Labs    08/13/2019 1315 09/04/19 0422  LABPROT 22.7* 24.6*  INR 2.0* 2.2*   Hepatitis Panel Recent Labs    09/02/2019 1315  HEPBSAG NON REACTIVE  HCVAB NON REACTIVE  HEPAIGM NON REACTIVE  HEPBIGM NON REACTIVE    Studies/Results: CT ABDOMEN PELVIS WO CONTRAST  Result Date: 08/18/2019 CLINICAL DATA:  Abdominal pain with diarrhea and jaundice since COVID-19 vaccination 4 days ago. EXAM: CT ABDOMEN AND PELVIS WITHOUT CONTRAST TECHNIQUE: Multidetector CT imaging of the abdomen and pelvis was performed following the standard protocol without IV contrast. COMPARISON:  Abdominopelvic CT 03/02/2019. FINDINGS: Lower chest: Stable band like density in the right lower lobe, likely scarring or chronic atelectasis. The lung bases are otherwise clear. No significant pleural or pericardial effusion. Hepatobiliary: Diffuse hepatic steatosis is again noted, and this now appears more heterogeneous in distribution. There is apparent focal fat centrally in the liver, extending into the caudate lobe and measuring up to 5.1 x 3.4 cm transverse on  image 29/3. This extends up to 8.7 cm in length on coronal image 74/6 and exerts mass effect on the IVC and hepatic veins which appear patent. Multiple gallstones are again noted. The gallbladder is distended without wall thickening or surrounding inflammation. No apparent intrahepatic or extrahepatic biliary dilatation. Pancreas: Unremarkable. No pancreatic ductal dilatation or surrounding inflammatory changes. Spleen: Normal in size without focal abnormality.  Adrenals/Urinary Tract: Both adrenal glands appear normal. The kidneys appear unremarkable as imaged in the noncontrast state. No evidence of urinary tract calculus, hydronephrosis or asymmetric perinephric soft tissue stranding. Mild bladder wall thickening, likely related to incomplete distension. Stomach/Bowel: No evidence of bowel wall thickening, distention or surrounding inflammatory change. The appendix is not clearly seen, but there is no focal pericecal inflammation. Vascular/Lymphatic: Multiple prominent retroperitoneal lymph nodes are similar to the previous study and likely reactive. As above, focal fat within the caudate lobe exerts mass effect on the IVC and hepatic veins. No evidence for vascular occlusion on noncontrast imaging. Reproductive: The prostate gland and seminal vesicles appear normal. Other: New small amount of low-density ascites in both pericolic gutters and the pelvis. Mild generalized edema throughout the subcutaneous and intra-abdominal fat. No focal extraluminal fluid or air collections identified. Musculoskeletal: No acute or significant osseous findings. Stable Schmorl's node involving the superior endplate of T11. IMPRESSION: 1. New small amount of low-density ascites and generalized edema throughout the subcutaneous and intra-abdominal fat consistent with anasarca. No focal extraluminal fluid collections. 2. Diffuse hepatic steatosis with focal fat in the caudate lobe. This exerts mass effect on the IVC and hepatic veins which appear patent on noncontrast imaging. This finding could contribute to the patient's jaundice, although no biliary dilatation is evident. Consider further evaluation with MRCP without and with contrast. 3. Cholelithiasis without evidence of cholecystitis or extrahepatic biliary dilatation. 4. Stable prominent retroperitoneal lymph nodes, likely reactive. Electronically Signed   By: Carey Bullocks M.D.   On: 2019-10-02 13:19   DG Chest Portable 1  View  Result Date: 10-02-19 CLINICAL DATA:  Abdominal pain and diarrhea since receiving COVID vaccination 4 days ago. EXAM: PORTABLE CHEST 1 VIEW COMPARISON:  None. FINDINGS: 1225 hours. The heart size and mediastinal contours are normal. The lungs are clear. There is no pleural effusion or pneumothorax. No acute osseous findings are identified. Telemetry leads overlie the chest. IMPRESSION: No active cardiopulmonary process. Electronically Signed   By: Carey Bullocks M.D.   On: Oct 02, 2019 12:50    Assessment: 49 year old male with history of alcohol abuse, ETOH pancreatitis in Sept 2020, presenting with abdominal pain, nausea, vomiting, diarrhea, and found to be severely jaundiced with bilirubin 21. INR elevated to 2.0. Findings consistent with alcoholic hepatitis, and discriminant function markedly elevated at 65.7 using average PT level. Although lipase mildly elevated at 100, no obvious changes of pancreatitis on non-contrast CT. Suspected abdominal pain likely more related to anasarca. Small amount of ascites on CT.   Patient has had acute change in mental status today with decreased responsiveness. Will squeeze my hands but doesn't follow other commands.  Ammonia is elevated at 149, up from 85 yesterday.  Slight downtrend in LFTs today but still elevated with AST to ALT ratio greater than 2:1. INR 2.2.  Repeat DF today ranges from 62.4-79.9. Acute hepatitis panel was completed on 02-Oct-2019 and was negative.   He will need to be started on steroids.  Spoke with Dr. Darrick Penna about patients abdominal pain. Little concern for SBP considering very small amount of ascites yesterday. Per  Dr. Sherryll Burger, no change in patients abdomen since yesterday.  Ultimately, we decided to proceed with steroids for alcoholic hepatitis. Due to acute change in mental status, will start IV methylprednisolone 32 mg daily.  Hepatic encephalopathy: Spoke with Dr. Darrick Penna.  Plan for NG tube, increase lactulose to 30 mL 4 times  daily and lactulose enema 3 times daily.  Hemorrhage of upper GI tract: After transfer to ICU, NG tube was placed and patient had significant amount of bleeding. I was called by Dr. Sherryll Burger. Discussed with Dr. Sherryll Burger who had also discussed with Dr. Darrick Penna. Suspected to be secondary to be secondary to NG tube insertion in the setting of alcoholic gastritis, elevated INR, and low platelets. Stat H&H with hemoglobin to 8.6 from 9.6.  Plans to IV Protonix drip and give vitamin K for INR reversal.    Plan: Agree with transfer to ICU due to changes in mental status. Plan for NG tube. Increase lactulose to 30 mL 4 times daily per tube and lactulose enema 3 times daily. Start methylprednisolone 32 mg daily. Protonix infusion initiated.  Vitamin K today.  Follow H/H closely. Transfuse as necessary.  Reassess in the morning.     LOS: 1 day    09/04/2019, 9:09 AM   Ermalinda Memos, [facility]

## 2019-09-04 NOTE — Progress Notes (Signed)
MEWS score yellow, P. 125 and BP 97/73. MD paged , no new orders. Will continue to monitor patient.

## 2019-09-04 NOTE — Progress Notes (Signed)
MEWS Guidelines - (patients age 48 and over)  Patient has a MEWS score of 3. MD and RN are aware as this is not an acute change. Will continue to monitor patient throughout the day.   Red - At High Risk for Deterioration Yellow - At risk for Deterioration  1. Go to room and assess patient 2. Validate data. Is this patient's baseline? If data confirmed: 3. Is this an acute change? 4. Administer prn meds/treatments as ordered. 5. Note Sepsis score 6. Review goals of care 7. Radiation protection practitioner, RRT nurse and Provider. 8. Ask Provider to come to bedside.  9. Document patient condition/interventions/response. 10. Increase frequency of vital signs and focused assessments to at least q15 minutes x 4, then q30 minutes x2. - If stable, then q1h x3, then q4h x3 and then q8h or dept. routine. - If unstable, contact Provider & RRT nurse. Prepare for possible transfer. 11. Add entry in progress notes using the smart phrase ".MEWS". 1. Go to room and assess patient 2. Validate data. Is this patient's baseline? If data confirmed: 3. Is this an acute change? 4. Administer prn meds/treatments as ordered? 5. Note Sepsis score 6. Review goals of care 7. Radiation protection practitioner and Provider 8. Call RRT nurse as needed. 9. Document patient condition/interventions/response. 10. Increase frequency of vital signs and focused assessments to at least q2h x2. - If stable, then q4h x2 and then q8h or dept. routine. - If unstable, contact Provider & RRT nurse. Prepare for possible transfer. 11. Add entry in progress notes using the smart phrase ".MEWS".  Green - Likely stable Lavender - Comfort Care Only  1. Continue routine/ordered monitoring.  2. Review goals of care. 1. Continue routine/ordered monitoring. 2. Review goals of care.

## 2019-09-04 NOTE — Progress Notes (Signed)
Patient continued to [place], [place]- Timothy Houston assessed patient and agreed to transfer patient to stepdown. NG tube placed and verified, transferred care to [place] in ICU room 5.

## 2019-09-04 NOTE — Progress Notes (Signed)
Patient had yellow MEWS due to patient being tachycardic. Mid-level paged, no new orders given. Will continue to monitor patient.

## 2019-09-04 NOTE — Progress Notes (Addendum)
PROGRESS NOTE    Timothy Houston  XFG:[id number] DOB: 04/12/1971 DOA: Sep 16, 2019 PCP: Maryruth Hancock, MD   Brief Narrative:  Per HPI: Timothy Houston is a 49 y.o. male with medical history significant for alcohol abuse with prior pancreatitis, type 2 diabetes, neuromuscular disorder, and seizure disorder who states that he received his first Covid vaccine approximately 5 days ago after which point in time he began to develop some abdominal pain, nausea and vomiting as well as diarrhea and jaundice.  He states that his diarrhea has resolved and now he has generalized myalgias and abdominal distention.  He denies any fevers or chills.  He reports drinking half a bottle of vodka daily and states that he is under a great deal of stress.  He denies any chest pain or shortness of breath.  3/30: Patient noted to be less responsive this morning with elevated ammonia levels.  Due to his unresponsiveness, plans were to place NG tube and administer lactulose via the NG tube as well as with enemas.  He has been transferred to stepdown unit for closer monitoring given his altered mentation.  Unfortunately, he is noted to have significant amounts of bleeding after placement of the NG tube.  This was discussed with GI and they have recommended Protonix infusion and vitamin K has been given for INR reversal.  His repeat H&H does show some decrease, but not to the point of requiring PRBC transfusion.  He will need continued close monitoring and repeat CBC levels.  He has been started on methylprednisolone for his alcoholic hepatitis.  He was also noted to have some hyperglycemia and has been started on D5 fluid for stabilization of his blood glucose.  Assessment & Plan:   Active Problems:   Alcoholic hepatitis with ascites   Intractable nausea and vomiting with suspected alcoholic hepatitis -Appreciate GI consultation and assistance with management -IV methylprednisolone 32 mg daily initiated -Acute hepatitis panel  negative  Acute hepatic encephalopathy related to above -Appears to be worsening with dramatic increase in ammonia levels -Plans to keep on performing lactulose enemas 3 times daily for now -Repeat levels in a.m. -Plan to utilize NG tube for lactulose if bleeding resolves  Hemorrhage of the upper GI tract -Related likely to NG tube insertion in the setting of alcoholic gastritis -INR has been reversed with additional vitamin K, repeat INR in a.m. -Follow CBC in a.m. and transfuse for hemoglobin less than 7 -Appreciate GI evaluation -Protonix infusion initiated -Maintain on SCDs  AKI likely related to above processes -We will plan to check urine electrolytes -Check renal ultrasound -Monitor strict I's and O's and avoid nephrotoxic agents -Maintain on IV fluid -Repeat a.m. labs  Hypoglycemia likely related to acute hepatitis with history of type 2 diabetes -Continue on D5 IV fluid -Monitor every 4 hours -Prior hemoglobin A1c 4.0% on 03/02/2019, will repeat  Hypomagnesemia -Replete and reevaluate in a.m.  Hypothyroidism -Continue Synthroid and will convert to IV -TSH noted to be 3.012  Ongoing alcohol abuse -Monitor carefully on CIWA protocol  Anemia/thrombocytopenia -Secondary to ongoing alcohol abuse -Now worsening in setting of upper GI hemorrhage -Monitor CBC in a.m. and transfuse RBC products as needed -Avoid heparin agents   DVT prophylaxis:SCDs Code Status: Full Family Communication: None at bedside; tried to call fried that is listed with no response Disposition Plan: Continue to monitor closely in stepdown unit with interventions as noted above.  Appreciate GI ongoing evaluation and management.  Prognosis is currently guarded.   Consultants:  GI  Procedures:   None  Antimicrobials:   None   Subjective: Patient seen and evaluated today and overall appears more somnolent and less responsive.  Objective: Vitals:   09/04/19 1245 09/04/19 1300  09/04/19 1315 09/04/19 1330  BP: (!) 95/51 (!) 58/44 (!) 83/55 (!) 100/55  Pulse: (!) 115 (!) 116 (!) 115 (!) 115  Resp: 19 20 20 19   Temp:      TempSrc:      SpO2: 91% 91% 92% 91%  Weight:      Height:        Intake/Output Summary (Last 24 hours) at 09/04/2019 1541 Last data filed at 09/04/2019 1525 Gross per 24 hour  Intake 3656.94 ml  Output 600 ml  Net 3056.94 ml   Filed Weights   09-18-19 1106 09-18-19 1634  Weight: 88.9 kg 90.5 kg    Examination:  General exam: Appears somnolent and much less responsive, jaundiced Respiratory system: Clear to auscultation. Respiratory effort normal.  Currently on room air Cardiovascular system: S1 & S2 heard, RRR. No JVD, murmurs, rubs, gallops or clicks. No pedal edema. Gastrointestinal system: Abdomen is distended and tympanic  Central nervous system: Mostly unresponsive Extremities: Edematous Skin: Jaundiced Psychiatry: Cannot be assessed given patient condition    Data Reviewed: I have personally reviewed following labs and imaging studies  CBC: Recent Labs  Lab September 18, 2019 1138 18-Sep-2019 1615 09/04/19 0422 09/04/19 1313  WBC 11.6* 10.4 10.9*  --   NEUTROABS 9.4*  --   --   --   HGB 10.7* 10.2* 9.6* 8.9*  HCT 29.9* 28.9* 27.4* 26.9*  MCV 97.4 97.3 98.9  --   PLT 82* 80* 74*  --    Basic Metabolic Panel: Recent Labs  Lab 09-18-2019 1137 2019-09-18 1615 09/04/19 0422  NA 133* 129* 135  K 3.8 2.8* 3.8  CL 87* 86* 96*  CO2 27 27 24   GLUCOSE 113* 110* 65*  BUN 13 14 17   CREATININE <0.30* 0.72 1.26*  CALCIUM 10.4* 10.0 9.3  MG  --  1.2* 1.1*  PHOS  --  4.2  --    GFR: Estimated Creatinine Clearance: 78.3 mL/min (A) (by C-G formula based on SCr of 1.26 mg/dL (H)). Liver Function Tests: Recent Labs  Lab 18-Sep-2019 1137 September 18, 2019 1615 09/04/19 0422  AST 325* 307* 276*  ALT 126* 119* 109*  ALKPHOS 217* 212* 183*  BILITOT 21.1* 20.9* 19.2*  PROT 7.0 6.8 6.2*  ALBUMIN 1.9* 1.8* 1.6*   Recent Labs  Lab  September 18, 2019 1137 09/04/19 0422  LIPASE 101* 56*   Recent Labs  Lab 09/18/2019 1315 09/04/19 0422  AMMONIA 85* 149*   Coagulation Profile: Recent Labs  Lab 09-18-19 1315 09/04/19 0422  INR 2.0* 2.2*   Cardiac Enzymes: No results for input(s): CKTOTAL, CKMB, CKMBINDEX, TROPONINI in the last 168 hours. BNP (last 3 results) No results for input(s): PROBNP in the last 8760 hours. HbA1C: No results for input(s): HGBA1C in the last 72 hours. CBG: Recent Labs  Lab 09/04/19 0753  GLUCAP 50*   Lipid Profile: No results for input(s): CHOL, HDL, LDLCALC, TRIG, CHOLHDL, LDLDIRECT in the last 72 hours. Thyroid Function Tests: Recent Labs    09/18/2019 1615  TSH 3.012   Anemia Panel: No results for input(s): VITAMINB12, FOLATE, FERRITIN, TIBC, IRON, RETICCTPCT in the last 72 hours. Sepsis Labs: No results for input(s): PROCALCITON, LATICACIDVEN in the last 168 hours.  Recent Results (from the past 240 hour(s))  SARS CORONAVIRUS 2 (TAT 6-24 HRS) Nasopharyngeal  Nasopharyngeal Swab     Status: None   Collection Time: 09-21-19  3:25 PM   Specimen: Nasopharyngeal Swab  Result Value Ref Range Status   SARS Coronavirus 2 NEGATIVE NEGATIVE Final    Comment: (NOTE) SARS-CoV-2 target nucleic acids are NOT DETECTED. The SARS-CoV-2 RNA is generally detectable in upper and lower respiratory specimens during the acute phase of infection. Negative results do not preclude SARS-CoV-2 infection, do not rule out co-infections with other pathogens, and should not be used as the sole basis for treatment or other patient management decisions. Negative results must be combined with clinical observations, patient history, and epidemiological information. The expected result is Negative. Fact Sheet for Patients: HairSlick.no Fact Sheet for Healthcare Providers: quierodirigir.com This test is not yet approved or cleared by the [place] FDA and   has been authorized for detection and/or diagnosis of SARS-CoV-2 by FDA under an Emergency Use Authorization (EUA). This EUA will remain  in effect (meaning this test can be used) for the duration of the COVID-19 declaration under Section 56 4(b)(1) of the Act, 21 U.S.C. section 360bbb-3(b)(1), unless the authorization is terminated or revoked sooner. Performed at [facility], [address] [place], [place] [address]          Radiology Studies: CT ABDOMEN PELVIS WO CONTRAST  Result Date: Sep 21, 2019 CLINICAL DATA:  Abdominal pain with diarrhea and jaundice since COVID-19 vaccination 4 days ago. EXAM: CT ABDOMEN AND PELVIS WITHOUT CONTRAST TECHNIQUE: Multidetector CT imaging of the abdomen and pelvis was performed following the standard protocol without IV contrast. COMPARISON:  Abdominopelvic CT 03/02/2019. FINDINGS: Lower chest: Stable band like density in the right lower lobe, likely scarring or chronic atelectasis. The lung bases are otherwise clear. No significant pleural or pericardial effusion. Hepatobiliary: Diffuse hepatic steatosis is again noted, and this now appears more heterogeneous in distribution. There is apparent focal fat centrally in the liver, extending into the caudate lobe and measuring up to 5.1 x 3.4 cm transverse on image 29/3. This extends up to 8.7 cm in length on coronal image 74/6 and exerts mass effect on the IVC and hepatic veins which appear patent. Multiple gallstones are again noted. The gallbladder is distended without wall thickening or surrounding inflammation. No apparent intrahepatic or extrahepatic biliary dilatation. Pancreas: Unremarkable. No pancreatic ductal dilatation or surrounding inflammatory changes. Spleen: Normal in size without focal abnormality. Adrenals/Urinary Tract: Both adrenal glands appear normal. The kidneys appear unremarkable as imaged in the noncontrast state. No evidence of urinary tract calculus, hydronephrosis or asymmetric  perinephric soft tissue stranding. Mild bladder wall thickening, likely related to incomplete distension. Stomach/Bowel: No evidence of bowel wall thickening, distention or surrounding inflammatory change. The appendix is not clearly seen, but there is no focal pericecal inflammation. Vascular/Lymphatic: Multiple prominent retroperitoneal lymph nodes are similar to the previous study and likely reactive. As above, focal fat within the caudate lobe exerts mass effect on the IVC and hepatic veins. No evidence for vascular occlusion on noncontrast imaging. Reproductive: The prostate gland and seminal vesicles appear normal. Other: New small amount of low-density ascites in both pericolic gutters and the pelvis. Mild generalized edema throughout the subcutaneous and intra-abdominal fat. No focal extraluminal fluid or air collections identified. Musculoskeletal: No acute or significant osseous findings. Stable Schmorl's node involving the superior endplate of T11. IMPRESSION: 1. New small amount of low-density ascites and generalized edema throughout the subcutaneous and intra-abdominal fat consistent with anasarca. No focal extraluminal fluid collections. 2. Diffuse hepatic steatosis with  focal fat in the caudate lobe. This exerts mass effect on the IVC and hepatic veins which appear patent on noncontrast imaging. This finding could contribute to the patient's jaundice, although no biliary dilatation is evident. Consider further evaluation with MRCP without and with contrast. 3. Cholelithiasis without evidence of cholecystitis or extrahepatic biliary dilatation. 4. Stable prominent retroperitoneal lymph nodes, likely reactive. Electronically Signed   By: Carey Bullocks M.D.   On: 08/21/2019 13:19   DG Chest Portable 1 View  Result Date: 08/15/2019 CLINICAL DATA:  Abdominal pain and diarrhea since receiving COVID vaccination 4 days ago. EXAM: PORTABLE CHEST 1 VIEW COMPARISON:  None. FINDINGS: 1225 hours. The heart  size and mediastinal contours are normal. The lungs are clear. There is no pleural effusion or pneumothorax. No acute osseous findings are identified. Telemetry leads overlie the chest. IMPRESSION: No active cardiopulmonary process. Electronically Signed   By: Carey Bullocks M.D.   On: 09/04/2019 12:50        Scheduled Meds: . Chlorhexidine Gluconate Cloth  6 each Topical Daily  . folic acid  1 mg Oral Daily  . gabapentin  800 mg Oral TID  . influenza vac split quadrivalent PF  0.5 mL Intramuscular Tomorrow-1000  . lactulose  20 g Per Tube Q6H  . lactulose  300 mL Rectal TID  . levothyroxine  50 mcg Oral Daily  . methylPREDNISolone (SOLU-MEDROL) injection  32 mg Intravenous Daily  . multivitamin with minerals  1 tablet Oral Daily  . [START ON 09/08/2019] pantoprazole  40 mg Intravenous Q12H  . sodium chloride flush  3 mL Intravenous Once  . thiamine  100 mg Intravenous Daily  . thiamine  100 mg Oral Daily   Continuous Infusions: . dextrose 5 % and 0.9% NaCl 100 mL/hr at 09/04/19 1235  . magnesium sulfate bolus IVPB 50 mL/hr at 09/04/19 1525  . pantoprozole (PROTONIX) infusion 8 mg/hr (09/04/19 1525)  . phytonadione (VITAMIN K) IV 50 mL/hr at 09/04/19 1525     LOS: 1 day    Time spent: 55 minutes    Stori Royse D Sherryll Burger, DO [facility]  If 7PM-7AM, please contact night-coverage [email or web address] 09/04/2019, 3:41 PM

## 2019-09-05 ENCOUNTER — Telehealth: Payer: Self-pay | Admitting: Family Medicine

## 2019-09-05 ENCOUNTER — Inpatient Hospital Stay (HOSPITAL_COMMUNITY): Payer: Medicaid Other

## 2019-09-05 DIAGNOSIS — Z515 Encounter for palliative care: Secondary | ICD-10-CM

## 2019-09-05 DIAGNOSIS — K922 Gastrointestinal hemorrhage, unspecified: Secondary | ICD-10-CM | POA: Diagnosis not present

## 2019-09-05 DIAGNOSIS — N179 Acute kidney failure, unspecified: Secondary | ICD-10-CM | POA: Diagnosis not present

## 2019-09-05 DIAGNOSIS — K7290 Hepatic failure, unspecified without coma: Secondary | ICD-10-CM | POA: Diagnosis not present

## 2019-09-05 DIAGNOSIS — Z66 Do not resuscitate: Secondary | ICD-10-CM

## 2019-09-05 DIAGNOSIS — R17 Unspecified jaundice: Secondary | ICD-10-CM

## 2019-09-05 DIAGNOSIS — Z7189 Other specified counseling: Secondary | ICD-10-CM

## 2019-09-05 LAB — COMPREHENSIVE METABOLIC PANEL
ALT: 127 U/L — ABNORMAL HIGH (ref 0–44)
AST: 399 U/L — ABNORMAL HIGH (ref 15–41)
Albumin: 1.4 g/dL — ABNORMAL LOW (ref 3.5–5.0)
Alkaline Phosphatase: 117 U/L (ref 38–126)
BUN: 25 mg/dL — ABNORMAL HIGH (ref 6–20)
CO2: 14 mmol/L — ABNORMAL LOW (ref 22–32)
Calcium: 8.8 mg/dL — ABNORMAL LOW (ref 8.9–10.3)
Chloride: 97 mmol/L — ABNORMAL LOW (ref 98–111)
Creatinine, Ser: 3.55 mg/dL — ABNORMAL HIGH (ref 0.61–1.24)
GFR calc Af Amer: 22 mL/min — ABNORMAL LOW (ref >60)
GFR calc non Af Amer: 19 mL/min — ABNORMAL LOW (ref >60)
Glucose, Bld: 121 mg/dL — ABNORMAL HIGH (ref 70–99)
Potassium: 3.8 mmol/L (ref 3.5–5.1)
Sodium: 135 mmol/L (ref 135–145)
Total Bilirubin: 19.6 mg/dL (ref 0.3–1.2)
Total Protein: 5.5 g/dL — ABNORMAL LOW (ref 6.5–8.1)

## 2019-09-05 LAB — CBC
HCT: 26.7 % — ABNORMAL LOW (ref 39.0–52.0)
Hemoglobin: 8.4 g/dL — ABNORMAL LOW (ref 13.0–17.0)
MCH: 34.7 pg — ABNORMAL HIGH (ref 26.0–34.0)
MCHC: 31.5 g/dL (ref 30.0–36.0)
MCV: 110.3 fL — ABNORMAL HIGH (ref 80.0–100.0)
Platelets: 69 10*3/uL — ABNORMAL LOW (ref 150–400)
RBC: 2.42 MIL/uL — ABNORMAL LOW (ref 4.22–5.81)
RDW: 18 % — ABNORMAL HIGH (ref 11.5–15.5)
WBC: 7.5 10*3/uL (ref 4.0–10.5)
nRBC: 1.9 % — ABNORMAL HIGH (ref 0.0–0.2)

## 2019-09-05 LAB — MAGNESIUM: Magnesium: 1.8 mg/dL (ref 1.7–2.4)

## 2019-09-05 LAB — HEMOGLOBIN AND HEMATOCRIT, BLOOD
HCT: 32.6 % — ABNORMAL LOW (ref 39.0–52.0)
HCT: 28.4 % — ABNORMAL LOW (ref 39.0–52.0)
Hemoglobin: 9.6 g/dL — ABNORMAL LOW (ref 13.0–17.0)
Hemoglobin: 8.7 g/dL — ABNORMAL LOW (ref 13.0–17.0)

## 2019-09-05 LAB — CREATININE, URINE, RANDOM: Creatinine, Urine: 367.26 mg/dL

## 2019-09-05 LAB — GLUCOSE, CAPILLARY
Glucose-Capillary: 112 mg/dL — ABNORMAL HIGH (ref 70–99)
Glucose-Capillary: 94 mg/dL (ref 70–99)
Glucose-Capillary: 98 mg/dL (ref 70–99)

## 2019-09-05 LAB — SODIUM, URINE, RANDOM: Sodium, Ur: 6 mmol/L

## 2019-09-05 LAB — AMMONIA: Ammonia: 132 umol/L — ABNORMAL HIGH (ref 9–35)

## 2019-09-05 LAB — MRSA PCR SCREENING: MRSA by PCR: NEGATIVE

## 2019-09-05 LAB — LIPASE, BLOOD: Lipase: 31 U/L (ref 11–51)

## 2019-09-05 LAB — PROTIME-INR
INR: 3.7 — ABNORMAL HIGH (ref 0.8–1.2)
Prothrombin Time: 36.3 seconds — ABNORMAL HIGH (ref 11.4–15.2)

## 2019-09-05 MED ORDER — PHENYLEPHRINE HCL-NACL 10-0.9 MG/250ML-% IV SOLN
0.0000 ug/min | INTRAVENOUS | Status: DC
  Administered 2019-09-05: 20 ug/min via INTRAVENOUS
  Filled 2019-09-05: qty 250

## 2019-09-06 NOTE — Telephone Encounter (Signed)
Patient care giver called to inform the office this patient has passed away today

## 2019-09-06 NOTE — Consult Note (Signed)
Consultation Note Date: 09-17-2019   Patient Name: Timothy Houston  DOB: [birth year]  MRN: [id number]  Age / Sex: 49 y.o., male  PCP: Maryruth Hancock, MD Referring Physician: Barton Dubois, MD  Reason for Consultation: Establishing goals of care  HPI/Patient Profile: 49 y.o. male  with past medical history of alcohol abuse, pancreatitis, diabetes, neuromuscular disorder, seizure disorder, anxiety, chronic back pain, first COVID vaccine 5 days prior to admission admitted on 08/24/2019 with abd pain, nausea, vomiting with diarrhea and jaundice with generalized myalgias and abd distention related to suspected alcoholic hepatitis, encephalopathy, GI hemorrhage, AKI. GI following with recommendations and has communicated to father/sisters poor prognosis and high "likelihood of death in 1-2 weeks" if not improving with steroids.   Clinical Assessment and Goals of Care: I have reviewed records and discussed with bedside RN. Timothy Houston continues to decline and when I arrive to bedside he is unresponsive, jaundiced, toes cold and cyanotic, and agonal breathing with cannula in mouth.   I immediately spoke with his father, Timothy Houston, via telephone. Timothy Houston is not married, his mother is decreased, he has 3 sisters, a 59 yo daughter and a 10 yo Houston. I explained that the legal healthcare decision makers would be jointly the father and Houston. Timothy Houston tells me that family has been trying to reach Houston but unsuccessful but shares contact information with me so that I could try and reach him as well.   I shared with Timothy Houston my concern that Timothy Houston is quickly declining and I am very concerned about his breathing and that he is at the end of his life. I encouraged family to come to bedside to be with him if they are able and desire to do so. We discussed overall poor prognosis and aggressiveness of care at this stage. Timothy Houston has had a good conversation with Dr.  Oneida Alar yesterday and does not feel that we should pursue life support options. They decision was made for DNR and he does not want Timothy Houston to suffer but would rather [place] keep him comfortable. Timothy Houston will call family to come to bedside.   I left voicemail for Houston at the number provided by Timothy Houston but no response. Timothy Houston was planning to reach out to Timothy Houston's Houston's mother to try and reach him as well and to let her know the situation. At this time we will go with the decisions made by father, Timothy Houston, which are in the best interest of the patient as well.   Shortly after our conversation I was notified by nursing that Timothy Houston has died. Time of death 32. I notified Timothy Houston and family to come to bedside. I met with them briefly after they arrived to provide emotional support and condolences.   All questions/concerns addressed. Emotional support provided.   Primary Decision Maker NEXT OF KIN father along with Timothy Houston but cannot reach Houston    SUMMARY OF RECOMMENDATIONS   - DNR decided - Goal to keep him comfortable  Code Status/Advance Care Planning:  DNR   Symptom Management:  No symptoms of discomfort throughout end of life period.   Palliative Prophylaxis:   Frequent Pain Assessment, Oral Care and Turn Reposition  Psycho-social/Spiritual:   Desire for further Chaplaincy support:yes  Additional Recommendations: Grief/Bereavement Support  Prognosis:   Patient died shortly after my consultation.   Discharge Planning: Anticipated Hospital Death      Primary Diagnoses: Present on Admission: . Alcoholic hepatitis with ascites   I have reviewed the medical record, interviewed the patient and family, and examined the patient. The following aspects are pertinent.  Past Medical History:  Diagnosis Date  . Ambulates with cane   . Anxiety   . Back pain   . Neuromuscular disorder ([place])   . Seizures ([place])    Social History   Socioeconomic History  . Marital status: Divorced     Spouse name: Not on file  . Number of children: 2  . Years of education: 30  . Highest education level: High school graduate  Occupational History  . Occupation: disabled  Tobacco Use  . Smoking status: Current Every Day Smoker    Packs/day: 0.50    Types: Cigarettes  . Smokeless tobacco: Never Used  Substance and Sexual Activity  . Alcohol use: Yes    Comment: 1/2 fifth vodka a day   . Drug use: Yes    Types: Marijuana    Comment: uses nightly  . Sexual activity: Not on file  Other Topics Concern  . Not on file  Social History Narrative   Lives at home with his roommate.   Right-handed.   No daily caffeine use.   Social Determinants of Health   Financial Resource Strain:   . Difficulty of Paying Living Expenses:   Food Insecurity:   . Worried About Charity fundraiser in the Last Year:   . Arboriculturist in the Last Year:   Transportation Needs:   . Film/video editor (Medical):   Marland Kitchen Lack of Transportation (Non-Medical):   Physical Activity:   . Days of Exercise per Week:   . Minutes of Exercise per Session:   Stress:   . Feeling of Stress :   Social Connections:   . Frequency of Communication with Friends and Family:   . Frequency of Social Gatherings with Friends and Family:   . Attends Religious Services:   . Active Member of Clubs or Organizations:   . Attends Archivist Meetings:   Marland Kitchen Marital Status:    Family History  Problem Relation Age of Onset  . Other Mother        died in car accident  . Heart disease Father   . Other Maternal Grandfather        Thinks he had Charcot-Marie-Tooth Disease, along with maternal uncles.   Scheduled Meds: . Chlorhexidine Gluconate Cloth  6 each Topical Daily  . folic acid  1 mg Oral Daily  . influenza vac split quadrivalent PF  0.5 mL Intramuscular Tomorrow-1000  . lactulose  20 g Per Tube Q6H  . lactulose  300 mL Rectal TID  . levothyroxine  25 mcg Intravenous Daily  . methylPREDNISolone  (SOLU-MEDROL) injection  32 mg Intravenous Daily  . multivitamin with minerals  1 tablet Oral Daily  . [START ON 09/08/2019] pantoprazole  40 mg Intravenous Q12H  . sodium chloride flush  3 mL Intravenous Once  . thiamine  100 mg Intravenous Daily   Continuous Infusions: . dextrose 5 % and 0.9% NaCl 100 mL/hr at September 06, 2019 0722  .  pantoprozole (PROTONIX) infusion 8 mg/hr (09/04/19 2202)  . phytonadione (VITAMIN K) IV 50 mL/hr at 09/04/19 1525   PRN Meds:.LORazepam **OR** LORazepam, ondansetron **OR** ondansetron (ZOFRAN) IV No Known Allergies Review of Systems  Unable to perform ROS: Acuity of condition    Physical Exam Vitals and nursing note reviewed.  Constitutional:      Appearance: He is ill-appearing.     Interventions: Nasal cannula in place.  Cardiovascular:     Rate and Rhythm: Normal rate.     Comments: Hypotensive  Pulmonary:     Comments: Becoming agonal Abdominal:     General: There is distension.  Skin:    Coloration: Skin is jaundiced.     Comments: Toes cold and cyanotic; Generalized edema  Neurological:     Mental Status: He is unresponsive.     Vital Signs: BP (!) 85/48   Pulse 94   Temp 98.2 F (36.8 C) (Axillary)   Resp (!) 32   Ht 5' 8"  (1.727 m)   Wt 98.2 kg   SpO2 95%   BMI 32.92 kg/m  Pain Scale: PAINAD POSS *See Group Information*: 1-Acceptable,Awake and alert Pain Score: Asleep   SpO2: SpO2: 95 % O2 Device:SpO2: 95 % O2 Flow Rate: .O2 Flow Rate (L/min): 2 L/min  IO: Intake/output summary:   Intake/Output Summary (Last 24 hours) at 09/27/19 1610 Last data filed at 09/27/2019 9604 Gross per 24 hour  Intake 3416.86 ml  Output 1700 ml  Net 1716.86 ml    LBM: Last BM Date: 08/26/19 Baseline Weight: Weight: 88.9 kg Most recent weight: Weight: 98.2 kg     Palliative Assessment/Data:     Time In: 0900 Time Out: 1015 Time Total: 75 min Greater than 50%  of this time was spent counseling and coordinating care related to the  above assessment and plan.  Signed by: Vinie Sill, NP Palliative Medicine Team Pager # [phone] (M-F 8a-5p) Team Phone # [phone] (Nights/Weekends)

## 2019-09-06 NOTE — Progress Notes (Signed)
CRITICAL VALUE ALERT  Critical Value:  Bilirubin 19.6  Date & Time Notied:  10-01-19 @0718   Provider Notified: Dr  Orders Received/Actions taken: No new orders currently

## 2019-09-06 NOTE — Discharge Summary (Signed)
Death Summary  Timothy Houston QAS:[id number] DOB: 05-Aug-1970 DOA: 2019-09-25  PCP: Wandra Feinstein, MD PCP/Office notified: through Epic system on 09/27/19  Admit date: 09-25-2019 Date of Death: 09-27-2019  Final Diagnoses:  Active Problems:   Alcoholic hepatitis with ascites   Encephalopathy, hepatic (HCC)   Gastric hemorrhage Hepatorenal syndrome Type 2 diabetes mellitus Hypothyroidism    History of present illness:  Per HPI: Timothy Smithis a 49 y.o.malewith medical history significant foralcohol abuse with prior pancreatitis, type 2 diabetes, neuromuscular disorder, and seizure disorder who states that he received his first Covid vaccine approximately 5 days ago after which point in time he began to develop some abdominal pain, nausea and vomiting as well as diarrhea and jaundice. He states that his diarrhea has resolved and now he has generalized myalgias and abdominal distention. He denies any fevers or chills. He reports drinking half a bottle of vodka daily and states that he is under a great deal of stress. He denies any chest pain or shortness of breath.  Hospital Course:  3/30: Patient noted to be less responsive this morning with elevated ammonia levels.  Due to his unresponsiveness, plans were to place NG tube and administer lactulose via the NG tube as well as with enemas.  He has been transferred to stepdown unit for closer monitoring given his altered mentation.  Unfortunately, he is noted to have significant amounts of bleeding after placement of the NG tube.  This was discussed with GI and they have recommended Protonix infusion and vitamin K has been given for INR reversal.  His repeat H&H does show some decrease, but not to the point of requiring PRBC transfusion.  He will need continued close monitoring and repeat CBC levels.  He has been started on methylprednisolone for his alcoholic hepatitis.  He was also noted to have some hyperglycemia and has been started on D5  fluid for stabilization of his blood glucose.  27-Sep-2022: Patient demonstrating worsening meld score and also acute hepatorenal syndrome with worsening on his renal function and coagulopathy.  Discrimination factor up to 125.  Continue to have significant jaundice and a bilirubin in the 19.6 range.  Patient prognosis was very poor hospital test with dissipated if no response to IV steroids received.  He remained obtunded and even able to move 4 limbs spontaneously unable to follow any commands.  Ammonia level also significantly elevated.  On today's evaluation no further signs of overt GI bleeding appreciated.  Patient was kept on IV PPI and IV steroids.  Blood pressure becomes of anemia ephinephrine transfer support was initiated.  After further discussion with palliative care and family members into his poor prognosis decision was made for patient to be DNR and no further escalation of inpatient therapy to be pursued.  Around 1042 am, patient heart rate stopped and he spontaneously stopped breathing. Patient expired without signs of discomfort. Family made aware and emotional support provided.    Time: 25 minutes  Signed:  Vassie Loll  [facility] 2019/09/27, 12:31 PM

## 2019-09-06 NOTE — Telephone Encounter (Signed)
FYI

## 2019-09-06 NOTE — Progress Notes (Signed)
Patient family in to see patient. Chaplain called and came to bedside and AJimmey Ralph NP also came to bedside. Emotional support provided to family/visitors. Family expressed that they will be using [facility] in [facility]. Harlan Stains and wife took patient belongings home with them.  Flexiseal removed.

## 2019-09-06 NOTE — Progress Notes (Signed)
At 0720, patient disoriented x4 and only mumbles/moans out upon verbal and touch. Patient respirations shallow and tachypnea. Soft B/P's. Dr Gwenlyn Perking aware and in to assess. Patient had low urine output overnight and has had no urine output since 7am. Dr Gwenlyn Perking aware and foley cath ordered and inserted via sterile technique. No urine output noted. Abdomen very distended and firm, Dr. Gwenlyn Perking aware. AJimmey Ralph Palliative NP in to assess and aware of patient status and patient made DNR per palliative. NEO started per orders. Patient heart rate noted to rapidly decline and respirations became agonal. Patient's friend at bedside. At 1042, there was no cardiac activity and no sounds ausculted by two RN's. Friend left shortly after. Dr. Gwenlyn Perking and Tobias Alexander palliative NP made aware. IV's, NG and foley cath removed and patient care provided. Awaiting family arrival.

## 2019-09-06 NOTE — Progress Notes (Signed)
Subjective: Agonal breathing. Obtunded. SBP 60s/40s. Caretaker present at bedside.   Objective: Vital signs in last 24 hours: Temp:  [97.5 F (36.4 C)-100.1 F (37.8 C)] 98.2 F (36.8 C) (03/31 0800) Pulse Rate:  [87-117] 91 (03/31 0900) Resp:  [19-32] 25 (03/31 0900) BP: (58-116)/(41-66) 97/41 (03/31 0900) SpO2:  [89 %-98 %] 97 % (03/31 0900) Weight:  [98.2 kg] 98.2 kg (03/31 0500) Last BM Date: 09/04/19 General:   Jaundiced, critically ill, obtunded, agonal breathing.  Abdomen:  Bowel sounds present, distended, no TTP, diffuse anasarca Extremities:  2 + pitting lower extremity edema  Neurologic:  Unable to assess   Intake/Output from previous day: 03/30 0701 - 03/31 0700 In: 3416.9 [I.V.:2270.1; IV Piggyback:1146.7] Out: 1700 [Urine:50; Emesis/NG output:750; Stool:900] Intake/Output this shift: No intake/output data recorded.  Lab Results: Recent Labs    09/04/19 0422 09/04/19 1313 09/04/19 1946 09/04/19 1946 08/24/2019 0202 08/27/2019 0431 08/23/2019 0805  WBC 10.9*  --  6.9  --   --  7.5  --   HGB 9.6*   < > 8.7*   < > 9.6* 8.4* 8.7*  HCT 27.4*   < > 26.1*   < > 32.6* 26.7* 28.4*  PLT 74*  --  65*  --   --  69*  --    < > = values in this interval not displayed.   BMET Recent Labs    09/13/2019 1615 09/04/19 0422 09/02/2019 0431  NA 129* 135 135  K 2.8* 3.8 3.8  CL 86* 96* 97*  CO2 27 24 14*  GLUCOSE 110* 65* 121*  BUN 14 17 25*  CREATININE 0.72 1.26* 3.55*  CALCIUM 10.0 9.3 8.8*   LFT Recent Labs    Sep 13, 2019 1615 09/04/19 0422 09/04/2019 0431  PROT 6.8 6.2* 5.5*  ALBUMIN 1.8* 1.6* 1.4*  AST 307* 276* 399*  ALT 119* 109* 127*  ALKPHOS 212* 183* 117  BILITOT 20.9* 19.2* 19.6*   PT/INR Recent Labs    09/04/19 0422 08/31/2019 0431  LABPROT 24.6* 36.3*  INR 2.2* 3.7*   Hepatitis Panel Recent Labs    2019-09-13 1315  HEPBSAG NON REACTIVE  HCVAB NON REACTIVE  HEPAIGM NON REACTIVE  HEPBIGM NON REACTIVE    Studies/Results: CT ABDOMEN PELVIS  WO CONTRAST  Result Date: 09-13-19 CLINICAL DATA:  Abdominal pain with diarrhea and jaundice since COVID-19 vaccination 4 days ago. EXAM: CT ABDOMEN AND PELVIS WITHOUT CONTRAST TECHNIQUE: Multidetector CT imaging of the abdomen and pelvis was performed following the standard protocol without IV contrast. COMPARISON:  Abdominopelvic CT 03/02/2019. FINDINGS: Lower chest: Stable band like density in the right lower lobe, likely scarring or chronic atelectasis. The lung bases are otherwise clear. No significant pleural or pericardial effusion. Hepatobiliary: Diffuse hepatic steatosis is again noted, and this now appears more heterogeneous in distribution. There is apparent focal fat centrally in the liver, extending into the caudate lobe and measuring up to 5.1 x 3.4 cm transverse on image 29/3. This extends up to 8.7 cm in length on coronal image 74/6 and exerts mass effect on the IVC and hepatic veins which appear patent. Multiple gallstones are again noted. The gallbladder is distended without wall thickening or surrounding inflammation. No apparent intrahepatic or extrahepatic biliary dilatation. Pancreas: Unremarkable. No pancreatic ductal dilatation or surrounding inflammatory changes. Spleen: Normal in size without focal abnormality. Adrenals/Urinary Tract: Both adrenal glands appear normal. The kidneys appear unremarkable as imaged in the noncontrast state. No evidence of urinary tract calculus, hydronephrosis or asymmetric perinephric  soft tissue stranding. Mild bladder wall thickening, likely related to incomplete distension. Stomach/Bowel: No evidence of bowel wall thickening, distention or surrounding inflammatory change. The appendix is not clearly seen, but there is no focal pericecal inflammation. Vascular/Lymphatic: Multiple prominent retroperitoneal lymph nodes are similar to the previous study and likely reactive. As above, focal fat within the caudate lobe exerts mass effect on the IVC and hepatic  veins. No evidence for vascular occlusion on noncontrast imaging. Reproductive: The prostate gland and seminal vesicles appear normal. Other: New small amount of low-density ascites in both pericolic gutters and the pelvis. Mild generalized edema throughout the subcutaneous and intra-abdominal fat. No focal extraluminal fluid or air collections identified. Musculoskeletal: No acute or significant osseous findings. Stable Schmorl's node involving the superior endplate of Q22. IMPRESSION: 1. New small amount of low-density ascites and generalized edema throughout the subcutaneous and intra-abdominal fat consistent with anasarca. No focal extraluminal fluid collections. 2. Diffuse hepatic steatosis with focal fat in the caudate lobe. This exerts mass effect on the IVC and hepatic veins which appear patent on noncontrast imaging. This finding could contribute to the patient's jaundice, although no biliary dilatation is evident. Consider further evaluation with MRCP without and with contrast. 3. Cholelithiasis without evidence of cholecystitis or extrahepatic biliary dilatation. 4. Stable prominent retroperitoneal lymph nodes, likely reactive. Electronically Signed   By: Richardean Sale M.D.   On: 08/06/2019 13:19   DG Chest Portable 1 View  Result Date: 08/16/2019 CLINICAL DATA:  Abdominal pain and diarrhea since receiving COVID vaccination 4 days ago. EXAM: PORTABLE CHEST 1 VIEW COMPARISON:  None. FINDINGS: 1225 hours. The heart size and mediastinal contours are normal. The lungs are clear. There is no pleural effusion or pneumothorax. No acute osseous findings are identified. Telemetry leads overlie the chest. IMPRESSION: No active cardiopulmonary process. Electronically Signed   By: Richardean Sale M.D.   On: 08/29/2019 12:50    Assessment: 49 year old male admitted with alcoholic hepatitis, markedly elevated DF on admission (65), mental status changes, rapid decline during admission and now with multi-organ  failure. Creatinine has climbed to 3.55, INR now 3.7, now with hypotension requiring neosynephrine and agonal breathing. Caretaker at bedside. Palliative care has spoken with family and caretaker, with likely plans to move to comfort care in the future but still treating for now. He has been made a DNR.   Alcoholic hepatitis: continue IV solumedrol as patient is not a candidate for oral administration. Discriminant function now greater than 125. MELD Na now 40. Mental status deteriorated rapidly since admission, now obtunded and with agonal breathing. Limited supportive options.   Dark NG tube return after placement yesterday: multifactorial with traumatic insertion in setting of coagulopathy, thrombocytopenia. No overt GI bleeding.   Appreciate palliative involvement. Anticipate a hospital death. Family is aware of grim prognosis, as palliative care has been updating as of this morning.   Plan: Continue IV solumedrol Continue PPI infusion Vit K 10 mg IV daily, 2 out of 3 doses given Continue lactulose for now DNR Limited options remain; will follow with you  Annitta Needs, PhD, ANP-BC [facility]     LOS: 2 days    October 05, 2019, 10:10 AM

## 2019-09-06 DEATH — deceased

## 2019-09-24 ENCOUNTER — Ambulatory Visit: Payer: Medicaid Other | Admitting: Neurology

## 2021-09-15 IMAGING — CT CT ABD-PELV W/O CM
2 of 4 series · 15 of 46 positions shown, 17 images · non-contrast
Comparison: Abdominopelvic CT 03/02/2019.

CLINICAL DATA: Abdominal pain with diarrhea and jaundice since
[id number] vaccination 4 days ago.

EXAM:
CT ABDOMEN AND PELVIS WITHOUT CONTRAST
TECHNIQUE: Multidetector CT imaging of the abdomen and pelvis was performed
following the standard protocol without IV contrast.

[Series 3: axial st · axial · 0.84mm/px · z∈[+643,+1103]mm · 12 of 106 slices shown, 14 images]
[im 7/106  soft-tissue]
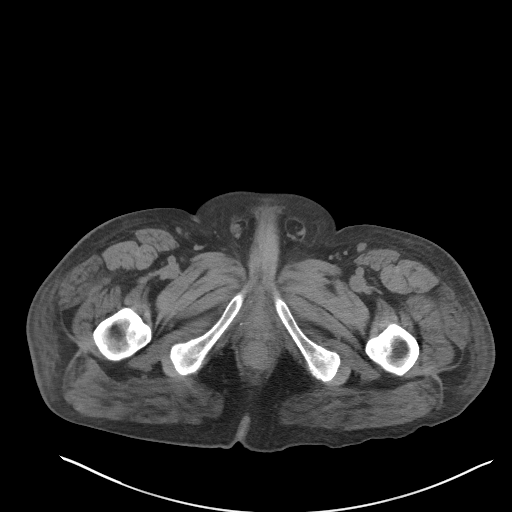
[im 7/106  bone]
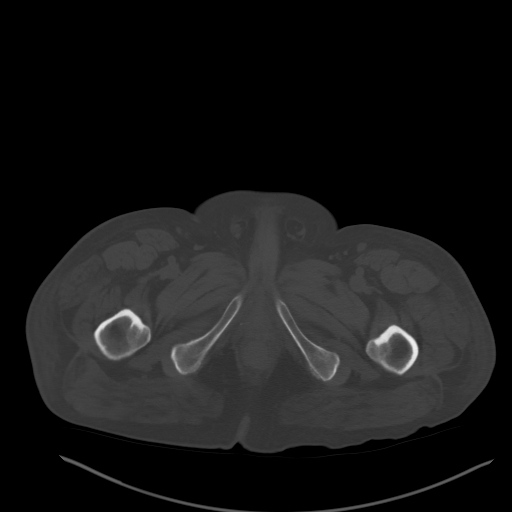
[im 19/106  soft-tissue]
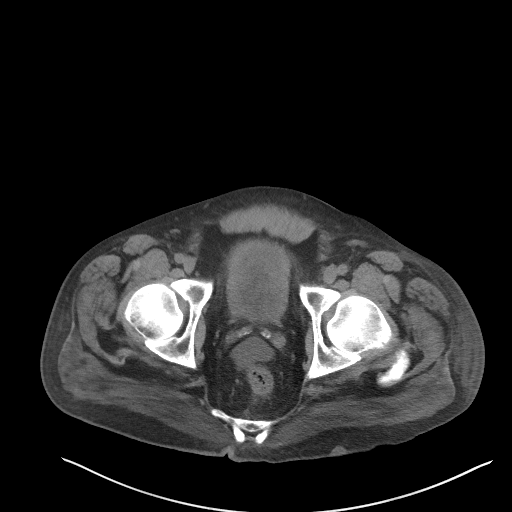
[im 25/106  soft-tissue]
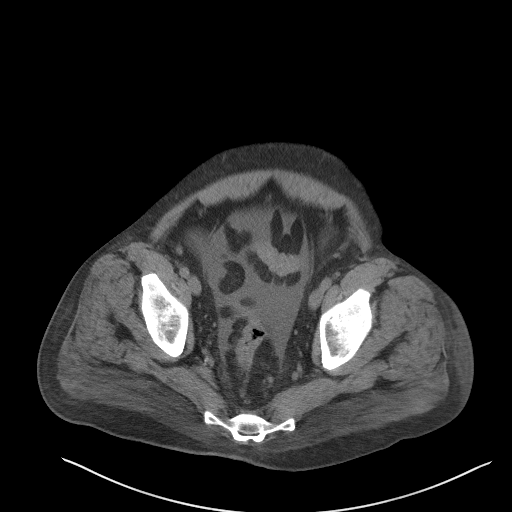
[im 31/106  soft-tissue]
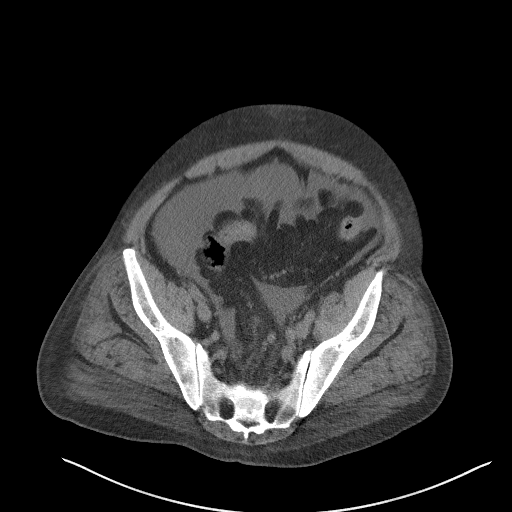
[im 44/106  soft-tissue]
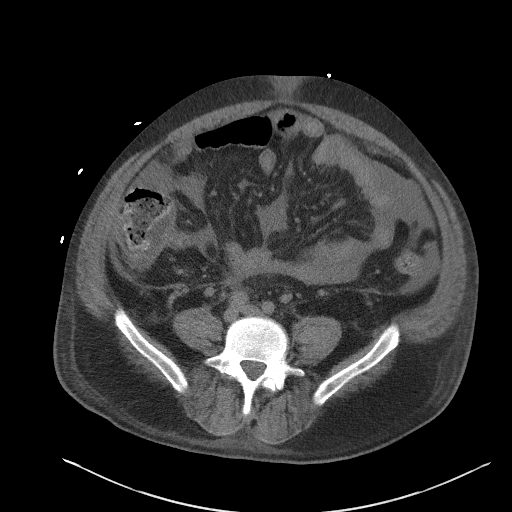
[im 50/106  soft-tissue]
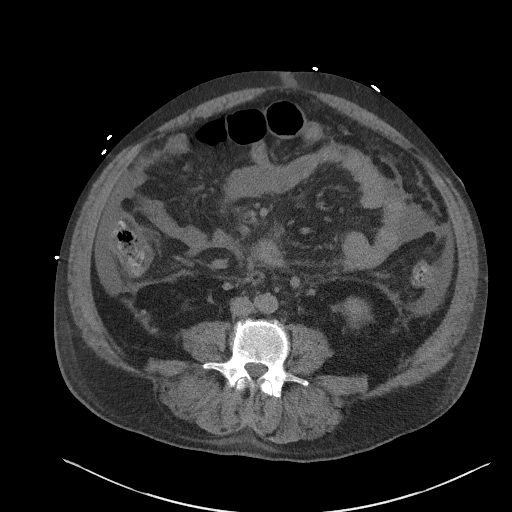
[im 56/106  soft-tissue]
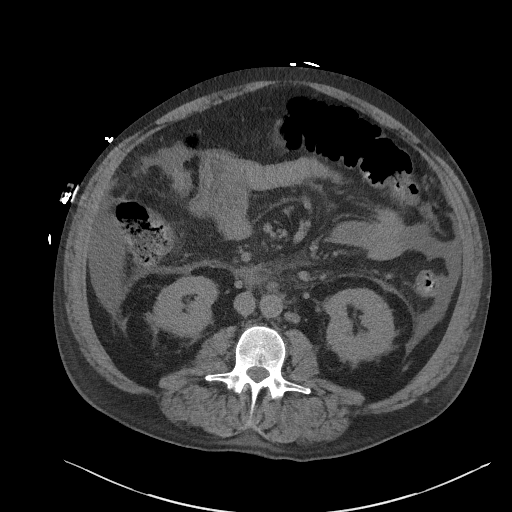
[im 68/106  soft-tissue]
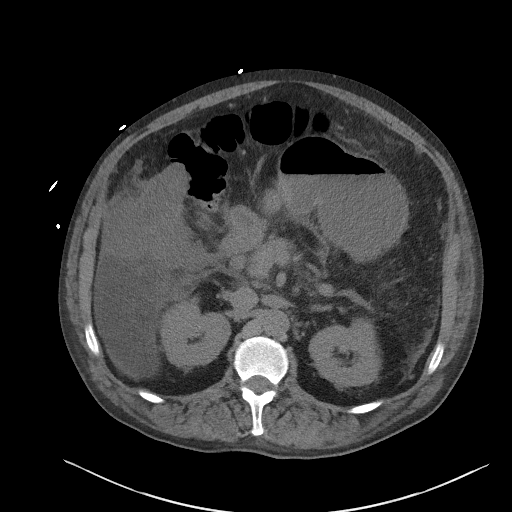
[im 75/106  soft-tissue]
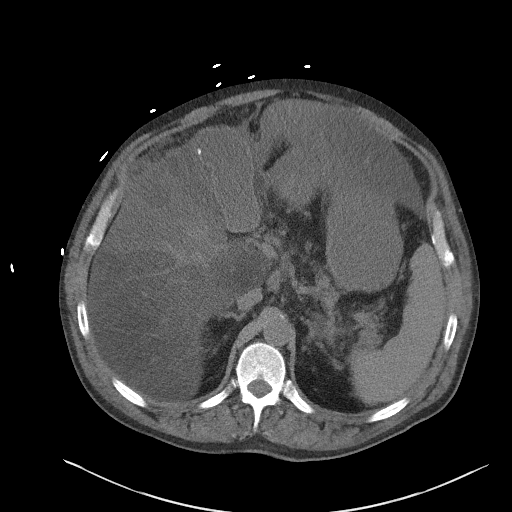
[im 75/106  bone]
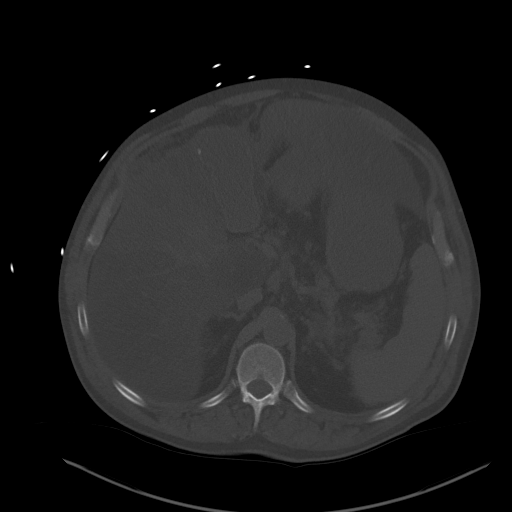
[im 81/106  soft-tissue]
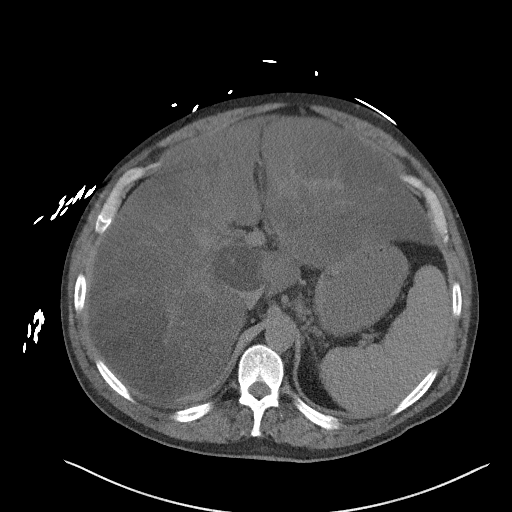
[im 93/106  soft-tissue]
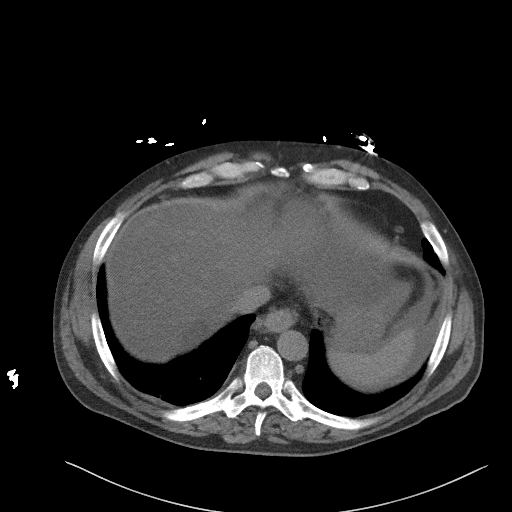
[im 99/106  soft-tissue]
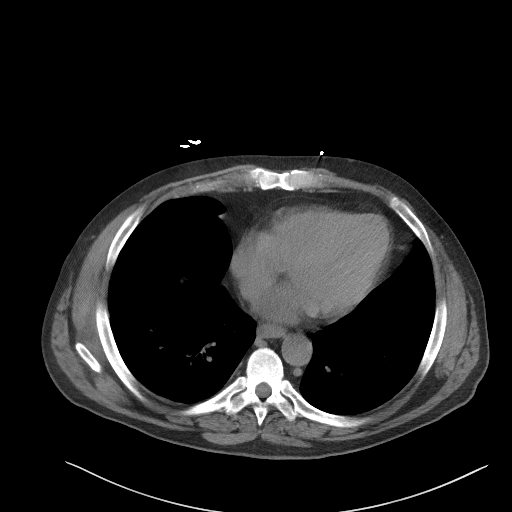

[Series 6: coronal st · coronal · 0.84mm/px · 3 of 143 slices shown]
[im 48/143  soft-tissue]
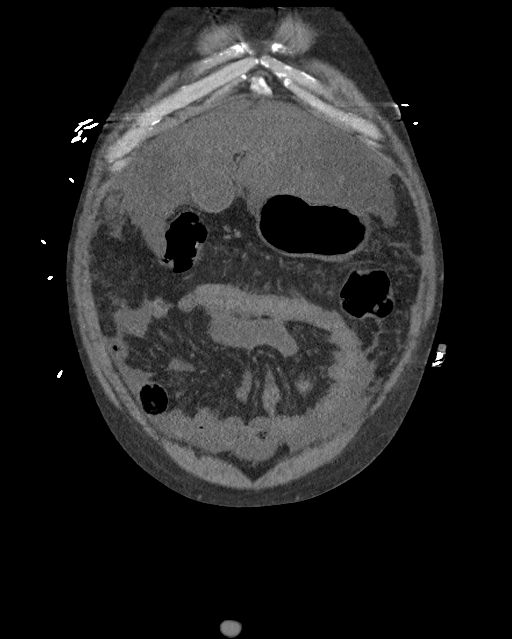
[im 64/143  soft-tissue]
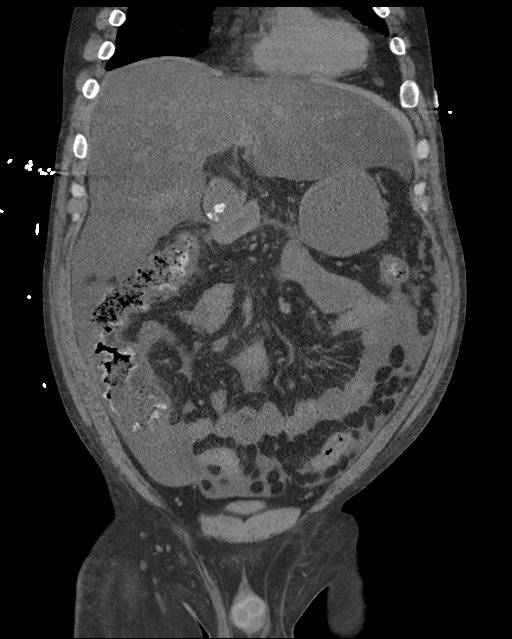
[im 79/143  soft-tissue]
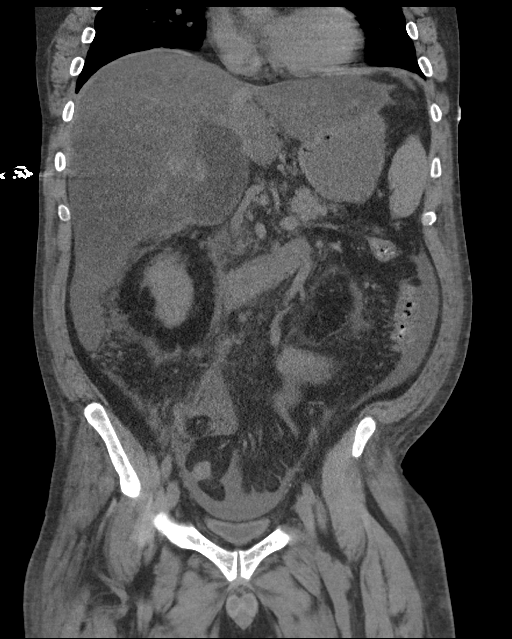

[15 of 46 positions shown; findings below may reference images not displayed]

FINDINGS: Lower chest: Stable band like density in the right lower lobe,
likely scarring or chronic atelectasis. The lung bases are otherwise
clear. No significant pleural or pericardial effusion.

Hepatobiliary: Diffuse hepatic steatosis is again noted, and this
now appears more heterogeneous in distribution. There is apparent
focal fat centrally in the liver, extending into the caudate lobe
and measuring up to 5.1 x 3.4 cm transverse on image [DATE]. This
extends up to 8.7 cm in length on coronal image 74/6 and exerts mass
effect on the IVC and hepatic veins which appear patent. Multiple
gallstones are again noted. The gallbladder is distended without
wall thickening or surrounding inflammation. No apparent
intrahepatic or extrahepatic biliary dilatation.

Pancreas: Unremarkable. No pancreatic ductal dilatation or
surrounding inflammatory changes.

Spleen: Normal in size without focal abnormality.

Adrenals/Urinary Tract: Both adrenal glands appear normal. The
kidneys appear unremarkable as imaged in the noncontrast state. No
evidence of urinary tract calculus, hydronephrosis or asymmetric
perinephric soft tissue stranding. Mild bladder wall thickening,
likely related to incomplete distension.

Stomach/Bowel: No evidence of bowel wall thickening, distention or
surrounding inflammatory change. The appendix is not clearly seen,
but there is no focal pericecal inflammation.

Vascular/Lymphatic: Multiple prominent retroperitoneal lymph nodes
are similar to the previous study and likely reactive. As above,
focal fat within the caudate lobe exerts mass effect on the IVC and
hepatic veins. No evidence for vascular occlusion on noncontrast
imaging.

Reproductive: The prostate gland and seminal vesicles appear normal.

Other: New small amount of low-density ascites in both pericolic
gutters and the pelvis. Mild generalized edema throughout the
subcutaneous and intra-abdominal fat. No focal extraluminal fluid or
air collections identified.

Musculoskeletal: No acute or significant osseous findings. Stable
Schmorl's node involving the superior endplate of T11.
IMPRESSION: 1. New small amount of low-density ascites and generalized edema
throughout the subcutaneous and intra-abdominal fat consistent with
anasarca. No focal extraluminal fluid collections.
2. Diffuse hepatic steatosis with focal fat in the caudate lobe.
This exerts mass effect on the IVC and hepatic veins which appear
patent on noncontrast imaging. This finding could contribute to the
patient's jaundice, although no biliary dilatation is evident.
Consider further evaluation with MRCP without and with contrast.
3. Cholelithiasis without evidence of cholecystitis or extrahepatic
biliary dilatation.
4. Stable prominent retroperitoneal lymph nodes, likely reactive.

## 2021-09-15 IMAGING — DX DG CHEST 1V PORT
1 series · 1 of 1 positions shown · non-contrast
Comparison: None.

CLINICAL DATA: Abdominal pain and diarrhea since receiving COVID
vaccination 4 days ago.

EXAM:
PORTABLE CHEST 1 VIEW

[chest ap]
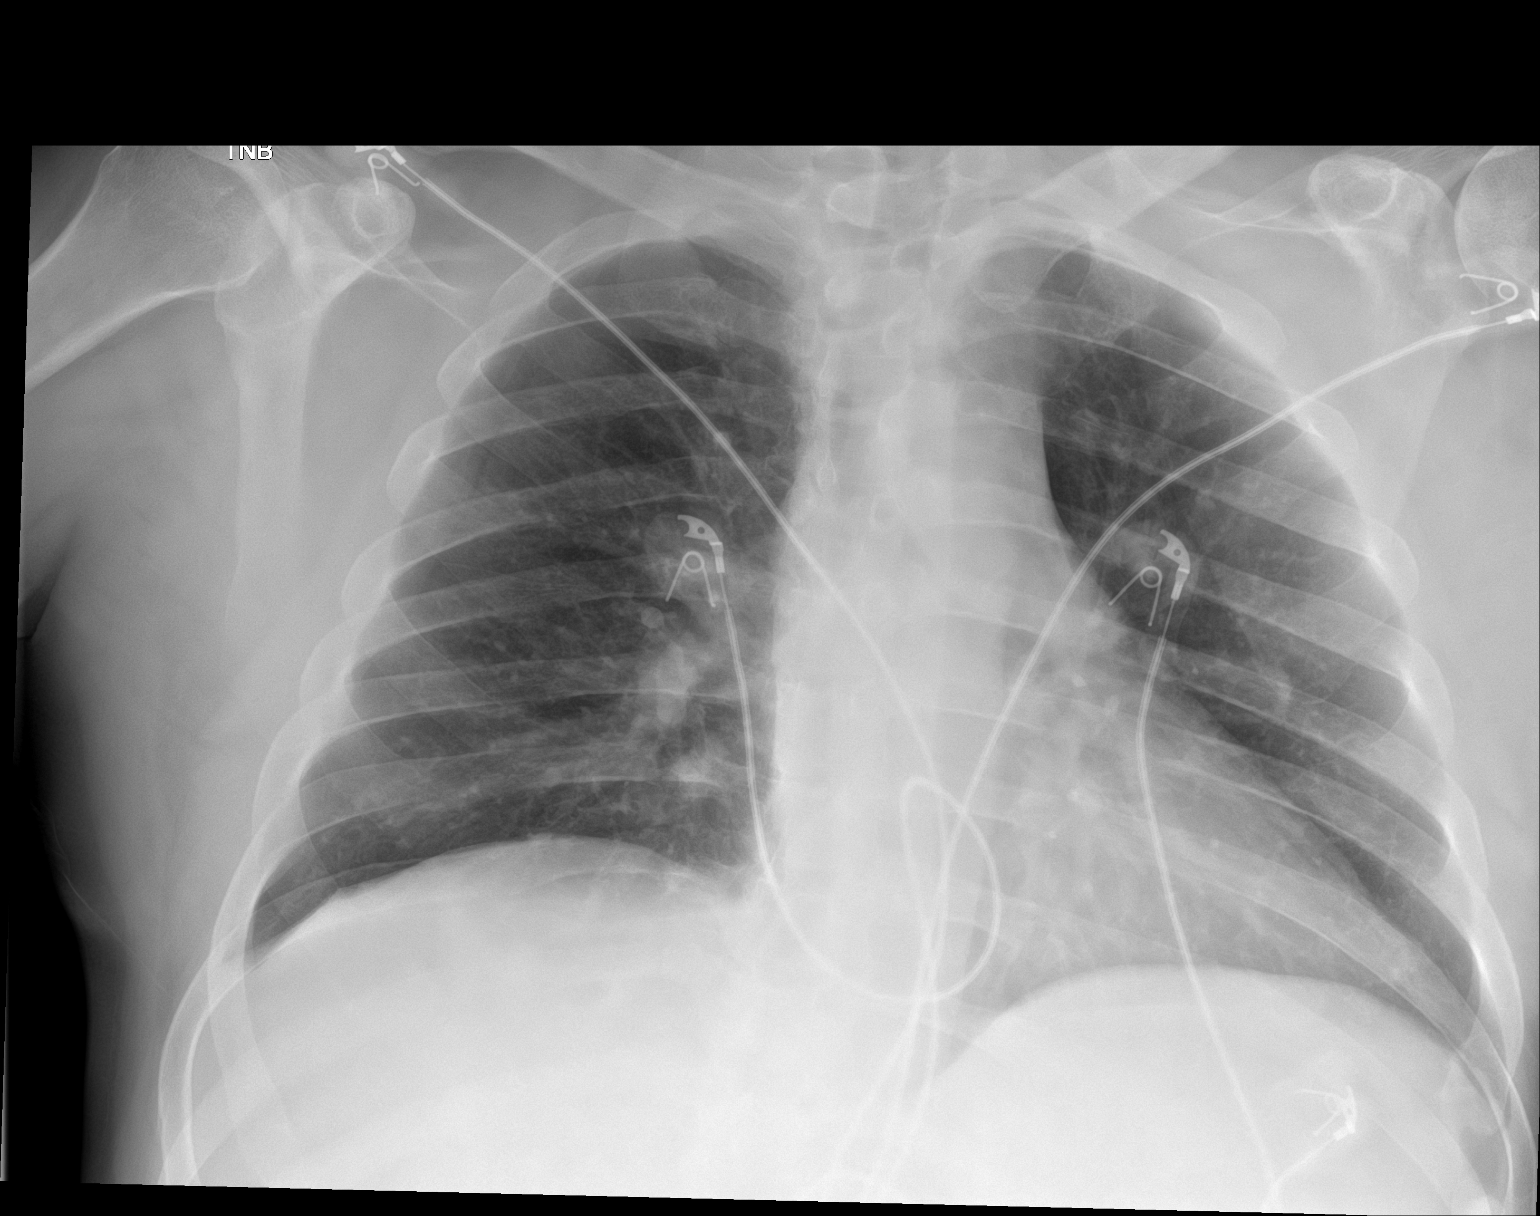

[1 of 1 positions shown; findings below may reference images not displayed]

FINDINGS: 1001 hours. The heart size and mediastinal contours are normal. The
lungs are clear. There is no pleural effusion or pneumothorax. No
acute osseous findings are identified. Telemetry leads overlie the
chest.
IMPRESSION: No active cardiopulmonary process.
